# Patient Record
Sex: Female | Born: 1965 | Race: White | Hispanic: No | Smoking: Never smoker
Health system: Southern US, Community
[De-identification: ages and names within clinical notes are randomized; demographics above are authoritative.]

## PROBLEM LIST (undated history)

## (undated) DIAGNOSIS — F319 Bipolar disorder, unspecified: Secondary | ICD-10-CM

## (undated) DIAGNOSIS — K859 Acute pancreatitis without necrosis or infection, unspecified: Secondary | ICD-10-CM

## (undated) DIAGNOSIS — I10 Essential (primary) hypertension: Secondary | ICD-10-CM

## (undated) DIAGNOSIS — E079 Disorder of thyroid, unspecified: Secondary | ICD-10-CM

## (undated) DIAGNOSIS — E876 Hypokalemia: Secondary | ICD-10-CM

## (undated) DIAGNOSIS — E119 Type 2 diabetes mellitus without complications: Secondary | ICD-10-CM

## (undated) HISTORY — PX: ABDOMINAL HYSTERECTOMY: SHX81

## (undated) HISTORY — PX: CHOLECYSTECTOMY: SHX55

---

## 2014-04-06 ENCOUNTER — Emergency Department (HOSPITAL_BASED_OUTPATIENT_CLINIC_OR_DEPARTMENT_OTHER)
Admission: EM | Admit: 2014-04-06 | Discharge: 2014-04-06 | Disposition: A | Discharge status: Home / Self Care | Payer: Medicare HMO | Attending: Emergency Medicine | Admitting: Emergency Medicine

## 2014-04-06 ENCOUNTER — Emergency Department (HOSPITAL_BASED_OUTPATIENT_CLINIC_OR_DEPARTMENT_OTHER): Payer: Medicare HMO

## 2014-04-06 ENCOUNTER — Encounter (HOSPITAL_BASED_OUTPATIENT_CLINIC_OR_DEPARTMENT_OTHER): Payer: Self-pay | Admitting: Emergency Medicine

## 2014-04-06 DIAGNOSIS — K7689 Other specified diseases of liver: Secondary | ICD-10-CM | POA: Insufficient documentation

## 2014-04-06 DIAGNOSIS — E079 Disorder of thyroid, unspecified: Secondary | ICD-10-CM | POA: Insufficient documentation

## 2014-04-06 DIAGNOSIS — K760 Fatty (change of) liver, not elsewhere classified: Secondary | ICD-10-CM

## 2014-04-06 DIAGNOSIS — I10 Essential (primary) hypertension: Secondary | ICD-10-CM | POA: Insufficient documentation

## 2014-04-06 DIAGNOSIS — Z794 Long term (current) use of insulin: Secondary | ICD-10-CM | POA: Insufficient documentation

## 2014-04-06 DIAGNOSIS — Z79899 Other long term (current) drug therapy: Secondary | ICD-10-CM | POA: Insufficient documentation

## 2014-04-06 DIAGNOSIS — R109 Unspecified abdominal pain: Secondary | ICD-10-CM | POA: Insufficient documentation

## 2014-04-06 DIAGNOSIS — E119 Type 2 diabetes mellitus without complications: Secondary | ICD-10-CM | POA: Insufficient documentation

## 2014-04-06 DIAGNOSIS — Z8719 Personal history of other diseases of the digestive system: Secondary | ICD-10-CM | POA: Insufficient documentation

## 2014-04-06 DIAGNOSIS — F319 Bipolar disorder, unspecified: Secondary | ICD-10-CM | POA: Insufficient documentation

## 2014-04-06 HISTORY — DX: Disorder of thyroid, unspecified: E07.9

## 2014-04-06 HISTORY — DX: Hypokalemia: E87.6

## 2014-04-06 HISTORY — DX: Type 2 diabetes mellitus without complications: E11.9

## 2014-04-06 HISTORY — DX: Acute pancreatitis without necrosis or infection, unspecified: K85.90

## 2014-04-06 HISTORY — DX: Bipolar disorder, unspecified: F31.9

## 2014-04-06 HISTORY — DX: Essential (primary) hypertension: I10

## 2014-04-06 LAB — COMPREHENSIVE METABOLIC PANEL
ALT: 82 U/L — ABNORMAL HIGH (ref 0–35)
ANION GAP: 16 — AB (ref 5–15)
AST: 70 U/L — ABNORMAL HIGH (ref 0–37)
Albumin: 4.2 g/dL (ref 3.5–5.2)
Alkaline Phosphatase: 111 U/L (ref 39–117)
BILIRUBIN TOTAL: 0.8 mg/dL (ref 0.3–1.2)
BUN: 11 mg/dL (ref 6–23)
CHLORIDE: 98 meq/L (ref 96–112)
CO2: 23 meq/L (ref 19–32)
CREATININE: 0.7 mg/dL (ref 0.50–1.10)
Calcium: 9.8 mg/dL (ref 8.4–10.5)
GFR calc Af Amer: >90 mL/min (ref >90)
Glucose, Bld: 254 mg/dL — ABNORMAL HIGH (ref 70–99)
Potassium: 3.3 mEq/L — ABNORMAL LOW (ref 3.7–5.3)
Sodium: 137 mEq/L (ref 137–147)
Total Protein: 8 g/dL (ref 6.0–8.3)

## 2014-04-06 LAB — CBC WITH DIFFERENTIAL/PLATELET
BASOS ABS: 0.1 10*3/uL (ref 0.0–0.1)
BASOS PCT: 1 % (ref 0–1)
Eosinophils Absolute: 0.4 10*3/uL (ref 0.0–0.7)
Eosinophils Relative: 4 % (ref 0–5)
HCT: 43.8 % (ref 36.0–46.0)
Hemoglobin: 15.2 g/dL — ABNORMAL HIGH (ref 12.0–15.0)
Lymphocytes Relative: 30 % (ref 12–46)
Lymphs Abs: 2.5 10*3/uL (ref 0.7–4.0)
MCH: 30.9 pg (ref 26.0–34.0)
MCHC: 34.7 g/dL (ref 30.0–36.0)
MCV: 89 fL (ref 78.0–100.0)
MONO ABS: 0.8 10*3/uL (ref 0.1–1.0)
Monocytes Relative: 10 % (ref 3–12)
NEUTROS ABS: 4.6 10*3/uL (ref 1.7–7.7)
NEUTROS PCT: 55 % (ref 43–77)
Platelets: 165 10*3/uL (ref 150–400)
RBC: 4.92 MIL/uL (ref 3.87–5.11)
RDW: 13.1 % (ref 11.5–15.5)
WBC: 8.3 10*3/uL (ref 4.0–10.5)

## 2014-04-06 LAB — URINE MICROSCOPIC-ADD ON

## 2014-04-06 LAB — URINALYSIS, ROUTINE W REFLEX MICROSCOPIC
Glucose, UA: 500 mg/dL — AB
Hgb urine dipstick: NEGATIVE
KETONES UR: NEGATIVE mg/dL
Nitrite: NEGATIVE
PROTEIN: 100 mg/dL — AB
Specific Gravity, Urine: 1.031 — ABNORMAL HIGH (ref 1.005–1.030)
Urobilinogen, UA: 0.2 mg/dL (ref 0.0–1.0)
pH: 6 (ref 5.0–8.0)

## 2014-04-06 LAB — LIPASE, BLOOD: Lipase: 33 U/L (ref 11–59)

## 2014-04-06 MED ORDER — PANTOPRAZOLE SODIUM 40 MG IV SOLR
40.0000 mg | Freq: Once | INTRAVENOUS | Status: AC
  Administered 2014-04-06: 40 mg via INTRAVENOUS
  Filled 2014-04-06: qty 40

## 2014-04-06 MED ORDER — ONDANSETRON HCL 4 MG/2ML IJ SOLN
4.0000 mg | Freq: Once | INTRAMUSCULAR | Status: AC
  Administered 2014-04-06: 4 mg via INTRAVENOUS
  Filled 2014-04-06: qty 2

## 2014-04-06 MED ORDER — SODIUM CHLORIDE 0.9 % IV SOLN
INTRAVENOUS | Status: DC
  Administered 2014-04-06: 07:00:00 via INTRAVENOUS

## 2014-04-06 MED ORDER — IOHEXOL 300 MG/ML  SOLN
50.0000 mL | Freq: Once | INTRAMUSCULAR | Status: AC | PRN
  Administered 2014-04-06: 50 mL via ORAL

## 2014-04-06 MED ORDER — ONDANSETRON HCL 4 MG/2ML IJ SOLN
4.0000 mg | Freq: Once | INTRAMUSCULAR | Status: AC
  Administered 2014-04-06: 4 mg via INTRAVENOUS

## 2014-04-06 MED ORDER — IOHEXOL 300 MG/ML  SOLN
100.0000 mL | Freq: Once | INTRAMUSCULAR | Status: AC | PRN
  Administered 2014-04-06: 100 mL via INTRAVENOUS

## 2014-04-06 MED ORDER — ONDANSETRON HCL 4 MG/2ML IJ SOLN
4.0000 mg | Freq: Once | INTRAMUSCULAR | Status: DC
  Filled 2014-04-06: qty 2

## 2014-04-06 MED ORDER — HYDROMORPHONE HCL PF 1 MG/ML IJ SOLN
1.0000 mg | Freq: Once | INTRAMUSCULAR | Status: AC
  Administered 2014-04-06: 1 mg via INTRAVENOUS
  Filled 2014-04-06: qty 1

## 2014-04-06 MED ORDER — SODIUM CHLORIDE 0.9 % IV BOLUS (SEPSIS): 1000.0000 mL | Freq: Once | INTRAVENOUS | Status: DC

## 2014-04-06 NOTE — ED Provider Notes (Signed)
CSN: [id number]     Arrival date & time 04/06/14  [id number] History   First MD Initiated Contact with Patient 04/06/14 [phone]     Chief Complaint  Patient presents with  . Abdominal Pain     (Consider location/radiation/quality/duration/timing/severity/associated sxs/prior Treatment) HPI This is a 48 year old female with a history of statin-induced pancreatitis. She was recently treated for a urinary tract infection and is on nitrofurantoin and phenazopyridine. She is here with a two-day history of epigastric right upper quadrant pain that acutely worsened this morning. Her pain is severe. It is sharp and stabbing. It is like previous pancreatitis pain. It has been associated with nausea, vomiting and diarrhea. It is worse with movement or palpation. She is status post cholecystectomy.  Past Medical History  Diagnosis Date  . Pancreatitis   . Thyroid disease   . Hypertension   . Diabetes mellitus without complication   . Hypokalemia   . Bipolar 1 disorder    Past Surgical History  Procedure Laterality Date  . Abdominal hysterectomy    . Cholecystectomy     No family history on file. History  Substance Use Topics  . Smoking status: Never Smoker   . Smokeless tobacco: Not on file  . Alcohol Use: No   OB History   Grav Para Term Preterm Abortions TAB SAB Ect Mult Living                 Review of Systems  All other systems reviewed and are negative.   Allergies  Crestor and Metformin and related  Home Medications   Prior to Admission medications   Medication Sig Start Date End Date Taking? Authorizing Provider  ARIPiprazole (ABILIFY) 10 MG tablet Take 10 mg by mouth daily.   Yes Historical Provider, MD  chlorthalidone (HYGROTON) 25 MG tablet Take 25 mg by mouth daily.   Yes Historical Provider, MD  citalopram (CELEXA) 20 MG tablet Take 20 mg by mouth daily.   Yes Historical Provider, MD  ergocalciferol (DRISDOL) 8000 UNIT/ML drops Take by mouth daily.   Yes Historical  Provider, MD  esomeprazole (NEXIUM) 40 MG capsule Take 40 mg by mouth daily at 12 noon.   Yes Historical Provider, MD  glimepiride (AMARYL) 2 MG tablet Take 2 mg by mouth daily with breakfast.   Yes Historical Provider, MD  hydrOXYzine (ATARAX/VISTARIL) 50 MG tablet Take 50 mg by mouth 3 (three) times daily as needed.   Yes Historical Provider, MD  insulin glargine (LANTUS) 100 UNIT/ML injection Inject into the skin at bedtime.   Yes Historical Provider, MD  lamoTRIgine (LAMICTAL) 100 MG tablet Take 100 mg by mouth daily.   Yes Historical Provider, MD  levothyroxine (SYNTHROID, LEVOTHROID) 100 MCG tablet Take 100 mcg by mouth daily before breakfast.   Yes Historical Provider, MD  meloxicam (MOBIC) 15 MG tablet Take 15 mg by mouth daily.   Yes Historical Provider, MD  nitrofurantoin (MACRODANTIN) 50 MG capsule Take 50 mg by mouth 4 (four) times daily.   Yes Historical Provider, MD  phenazopyridine (PYRIDIUM) 200 MG tablet Take 200 mg by mouth 3 (three) times daily as needed for pain.   Yes Historical Provider, MD  potassium chloride (K-DUR) 10 MEQ tablet Take 20 mEq by mouth 3 (three) times daily.   Yes Historical Provider, MD   BP 126/96  Pulse 110  Temp(Src) 98.2 F (36.8 C) (Oral)  Resp 20  Ht 5\' 7"  (1.702 m)  Wt 260 lb (117.935 kg)  BMI 40.71 kg/m2  SpO2 97%  Physical Exam General: Well-developed, obese female in no acute distress; appearance consistent with age of record HENT: normocephalic; atraumatic Eyes: pupils equal, round and reactive to light; extraocular muscles intact; xanthomas Neck: supple Heart: regular rate and rhythm Lungs: clear to auscultation bilaterally Abdomen: soft; nondistended; epigastric and right upper quadrant tenderness; no masses or hepatosplenomegaly; bowel sounds present Extremities: No deformity; full range of motion; pulses normal Neurologic: Awake, alert and oriented; motor function intact in all extremities and symmetric; no facial droop Skin: Warm  and dry Psychiatric: Normal mood and affect    ED Course  Procedures (including critical care time)  MDM   Nursing notes and vitals signs, including pulse oximetry, reviewed.  Summary of this visit's results, reviewed by myself:  Labs:  Results for orders placed during the hospital encounter of 04/06/14 (from the past 24 hour(s))  LIPASE, BLOOD     Status: None   Collection Time    04/06/14  6:30 AM      Result Value Ref Range   Lipase 33  11 - 59 U/L  COMPREHENSIVE METABOLIC PANEL     Status: Abnormal   Collection Time    04/06/14  6:30 AM      Result Value Ref Range   Sodium 137  137 - 147 mEq/L   Potassium 3.3 (*) 3.7 - 5.3 mEq/L   Chloride 98  96 - 112 mEq/L   CO2 23  19 - 32 mEq/L   Glucose, Bld 254 (*) 70 - 99 mg/dL   BUN 11  6 - 23 mg/dL   Creatinine, Ser 1.61  0.50 - 1.10 mg/dL   Calcium 9.8  8.4 - 09.6 mg/dL   Total Protein 8.0  6.0 - 8.3 g/dL   Albumin 4.2  3.5 - 5.2 g/dL   AST 70 (*) 0 - 37 U/L   ALT 82 (*) 0 - 35 U/L   Alkaline Phosphatase 111  39 - 117 U/L   Total Bilirubin 0.8  0.3 - 1.2 mg/dL   GFR calc non Af Amer >90  >90 mL/min   GFR calc Af Amer >90  >90 mL/min   Anion gap 16 (*) 5 - 15  CBC WITH DIFFERENTIAL     Status: Abnormal   Collection Time    04/06/14  6:30 AM      Result Value Ref Range   WBC 8.3  4.0 - 10.5 K/uL   RBC 4.92  3.87 - 5.11 MIL/uL   Hemoglobin 15.2 (*) 12.0 - 15.0 g/dL   HCT 04.5  40.9 - 81.1 %   MCV 89.0  78.0 - 100.0 fL   MCH 30.9  26.0 - 34.0 pg   MCHC 34.7  30.0 - 36.0 g/dL   RDW 91.4  78.2 - 95.6 %   Platelets 165  150 - 400 K/uL   Neutrophils Relative % 55  43 - 77 %   Neutro Abs 4.6  1.7 - 7.7 K/uL   Lymphocytes Relative 30  12 - 46 %   Lymphs Abs 2.5  0.7 - 4.0 K/uL   Monocytes Relative 10  3 - 12 %   Monocytes Absolute 0.8  0.1 - 1.0 K/uL   Eosinophils Relative 4  0 - 5 %   Eosinophils Absolute 0.4  0.0 - 0.7 K/uL   Basophils Relative 1  0 - 1 %   Basophils Absolute 0.1  0.0 - 0.1 K/uL  URINALYSIS,  ROUTINE W REFLEX MICROSCOPIC  Status: Abnormal   Collection Time    04/06/14  6:36 AM      Result Value Ref Range   Color, Urine AMBER (*) YELLOW   APPearance CLOUDY (*) CLEAR   Specific Gravity, Urine 1.031 (*) 1.005 - 1.030   pH 6.0  5.0 - 8.0   Glucose, UA 500 (*) NEGATIVE mg/dL   Hgb urine dipstick NEGATIVE  NEGATIVE   Bilirubin Urine SMALL (*) NEGATIVE   Ketones, ur NEGATIVE  NEGATIVE mg/dL   Protein, ur 284 (*) NEGATIVE mg/dL   Urobilinogen, UA 0.2  0.0 - 1.0 mg/dL   Nitrite NEGATIVE  NEGATIVE   Leukocytes, UA SMALL (*) NEGATIVE  URINE MICROSCOPIC-ADD ON     Status: Abnormal   Collection Time    04/06/14  6:36 AM      Result Value Ref Range   Squamous Epithelial / LPF MANY (*) RARE   WBC, UA 7-10  <3 WBC/hpf   RBC / HPF 0-2  <3 RBC/hpf   Bacteria, UA MANY (*) RARE   Casts HYALINE CASTS (*) NEGATIVE   Urine-Other MUCOUS PRESENT      Imaging Studies: No results found.  7:36 AM Dr. Jodi Mourning to follow up on CT scan.      Hanley Seamen, MD 04/06/14 [phone]

## 2014-04-06 NOTE — Discharge Instructions (Signed)
If you were given medicines take as directed.  If you are on coumadin or contraceptives realize their levels and effectiveness is altered by many different medicines.  If you have any reaction (rash, tongues swelling, other) to the medicines stop taking and see a physician.   Please follow up as directed and return to the ER or see a physician for new or worsening symptoms.  Thank you. Filed Vitals:   04/06/14 0604 04/06/14 0747  BP: 126/96 113/40  Pulse: 110 94  Temp: 98.2 F (36.8 C)   TempSrc: Oral   Resp: 20 20  Height: 5\' 7"  (1.702 m)   Weight: 260 lb (117.935 kg)   SpO2: 97% 96%    Abdominal Pain, Women Abdominal (stomach, pelvic, or belly) pain can be caused by many things. It is important to tell your doctor:  The location of the pain.  Does it come and go or is it present all the time?  Are there things that start the pain (eating certain foods, exercise)?  Are there other symptoms associated with the pain (fever, nausea, vomiting, diarrhea)? All of this is helpful to know when trying to find the cause of the pain. CAUSES   Stomach: virus or bacteria infection, or ulcer.  Intestine: appendicitis (inflamed appendix), regional ileitis (Crohn's disease), ulcerative colitis (inflamed colon), irritable bowel syndrome, diverticulitis (inflamed diverticulum of the colon), or cancer of the stomach or intestine.  Gallbladder disease or stones in the gallbladder.  Kidney disease, kidney stones, or infection.  Pancreas infection or cancer.  Fibromyalgia (pain disorder).  Diseases of the female organs:  Uterus: fibroid (non-cancerous) tumors or infection.  Fallopian tubes: infection or tubal pregnancy.  Ovary: cysts or tumors.  Pelvic adhesions (scar tissue).  Endometriosis (uterus lining tissue growing in the pelvis and on the pelvic organs).  Pelvic congestion syndrome (female organs filling up with blood just before the menstrual period).  Pain with the menstrual  period.  Pain with ovulation (producing an egg).  Pain with an IUD (intrauterine device, birth control) in the uterus.  Cancer of the female organs.  Functional pain (pain not caused by a disease, may improve without treatment).  Psychological pain.  Depression. DIAGNOSIS  Your doctor will decide the seriousness of your pain by doing an examination.  Blood tests.  X-rays.  Ultrasound.  CT scan (computed tomography, special type of X-ray).  MRI (magnetic resonance imaging).  Cultures, for infection.  Barium enema (dye inserted in the large intestine, to better view it with X-rays).  Colonoscopy (looking in intestine with a lighted tube).  Laparoscopy (minor surgery, looking in abdomen with a lighted tube).  Major abdominal exploratory surgery (looking in abdomen with a large incision). TREATMENT  The treatment will depend on the cause of the pain.   Many cases can be observed and treated at home.  Over-the-counter medicines recommended by your caregiver.  Prescription medicine.  Antibiotics, for infection.  Birth control pills, for painful periods or for ovulation pain.  Hormone treatment, for endometriosis.  Nerve blocking injections.  Physical therapy.  Antidepressants.  Counseling with a psychologist or psychiatrist.  Minor or major surgery. HOME CARE INSTRUCTIONS   Do not take laxatives, unless directed by your caregiver.  Take over-the-counter pain medicine only if ordered by your caregiver. Do not take aspirin because it can cause an upset stomach or bleeding.  Try a clear liquid diet (broth or water) as ordered by your caregiver. Slowly move to a bland diet, as tolerated, if the pain  is related to the stomach or intestine.  Have a thermometer and take your temperature several times a day, and record it.  Bed rest and sleep, if it helps the pain.  Avoid sexual intercourse, if it causes pain.  Avoid stressful situations.  Keep your  follow-up appointments and tests, as your caregiver orders.  If the pain does not go away with medicine or surgery, you may try:  Acupuncture.  Relaxation exercises (yoga, meditation).  Group therapy.  Counseling. SEEK MEDICAL CARE IF:   You notice certain foods cause stomach pain.  Your home care treatment is not helping your pain.  You need stronger pain medicine.  You want your IUD removed.  You feel faint or lightheaded.  You develop nausea and vomiting.  You develop a rash.  You are having side effects or an allergy to your medicine. SEEK IMMEDIATE MEDICAL CARE IF:   Your pain does not go away or gets worse.  You have a fever.  Your pain is felt only in portions of the abdomen. The right side could possibly be appendicitis. The left lower portion of the abdomen could be colitis or diverticulitis.  You are passing blood in your stools (bright red or black tarry stools, with or without vomiting).  You have blood in your urine.  You develop chills, with or without a fever.  You pass out. MAKE SURE YOU:   Understand these instructions.  Will watch your condition.  Will get help right away if you are not doing well or get worse. Document Released: 07/06/2007 Document Revised: 12/01/2011 Document Reviewed: 07/26/2009 [facility] Patient Information 2015 [place], [place]. This information is not intended to replace advice given to you by your health care provider. Make sure you discuss any questions you have with your health care provider.

## 2014-04-06 NOTE — ED Notes (Signed)
Pt c/o upper abdominal pain with n/v/d x2days, states is currently being tx'd for a UTI

## 2014-04-06 NOTE — ED Provider Notes (Signed)
Patient signed out to followup CT scan and disposition. CT scan results reviewed with mild fatty liver, reassess patient feels much improved and no acute/emergent findings on the CT. Patient followup with primary Dr. Franchot Heidelbergt Abdomen Pelvis W Contrast  04/06/2014   CLINICAL DATA:  Upper abdominal pain and nausea.  EXAM: CT ABDOMEN AND PELVIS WITH CONTRAST  TECHNIQUE: Multidetector CT imaging of the abdomen and pelvis was performed using the standard protocol following bolus administration of intravenous contrast.  CONTRAST:  50mL OMNIPAQUE IOHEXOL 300 MG/ML SOLN, 100mL OMNIPAQUE IOHEXOL 300 MG/ML SOLN  COMPARISON:  01/13/2014.  05/21/2013.  07/16/2011.  05/10/2006.  FINDINGS: Lung bases are clear. No pleural or pericardial fluid. The liver is enlarged and shows fatty change. This could be painful. Relatively lower attenuation in the anterior aspect of the liver has been seen previously and is consistent with more advanced fatty infiltration. The spleen is chronically enlarged. Benign-appearing cyst in the dorsal aspect of the spleen. This measures 2 point cm transverse today as opposed to 2.1 cm on the previous exam. The pancreas is normal. The adrenal glands are normal. The kidneys are normal. The aorta shows mild atherosclerosis but no aneurysm. The IVC is normal. No free intraperitoneal fluid or air. There is diverticulosis of the colon without evidence of diverticulitis. The bladder is normal. There is been previous hysterectomy. No pelvic mass. 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 The appendix is normal.  IMPRESSION: Enlarged fatty liver. This is a chronic finding, but with slightly more liver enlargement over time. This could be a cause of pain. There is splenomegaly as well, increasing over time, with a benign cyst within the posterior spleen, slowly enlarged over time but not worrisome.  Diverticulosis of the colon without evidence of diverticulitis.   Electronically Signed   By:  Paulina FusiMark  Shogry M.D.   On: 04/06/2014 08:51   Abdominal pain, vomiting, Diverticulosis, Fatty liver  Maureena Dabbs M   Enid SkeensJoshua M Alyxis Grippi, MD 04/06/14 [phone]

## 2015-06-18 IMAGING — CT CT ABD-PELV W/ CM
2 of 5 series · 17 of 46 positions shown, 19 images · IV contrast (APPLIED)
Comparison: 01/13/2014.  05/21/2013.  07/16/2011.  05/10/2006.

CLINICAL DATA: Upper abdominal pain and nausea.

EXAM:
CT ABDOMEN AND PELVIS WITH CONTRAST
TECHNIQUE: Multidetector CT imaging of the abdomen and pelvis was performed
using the standard protocol following bolus administration of
intravenous contrast.
CONTRAST:  50mL OMNIPAQUE IOHEXOL 300 MG/ML SOLN, 100mL OMNIPAQUE
IOHEXOL 300 MG/ML SOLN

[Series 2: abd/pelvis 5.0 b31f · axial · 0.84mm/px · z∈[-492,-68]mm · 14 of 95 slices shown, 16 images]
[im 5/95  soft-tissue]
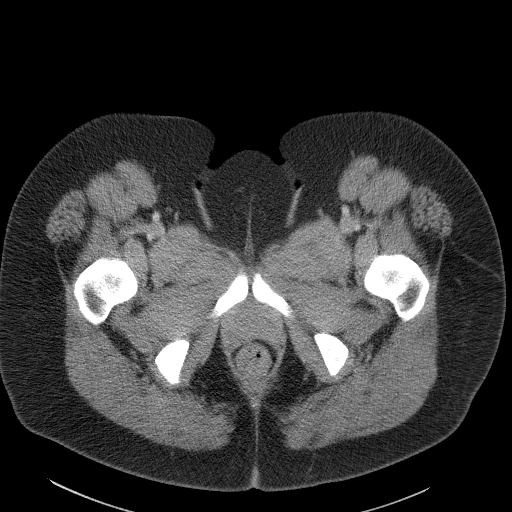
[im 5/95  bone]
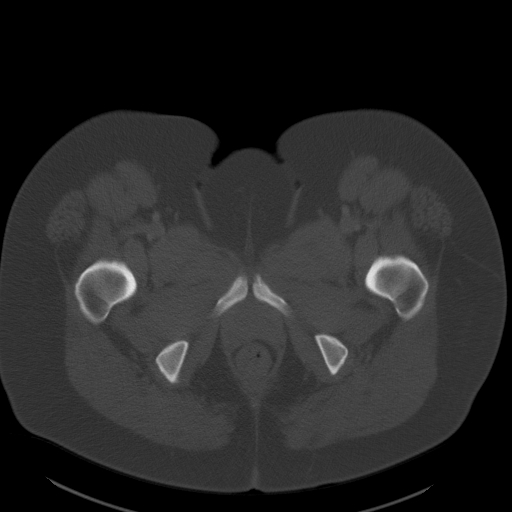
[im 10/95  soft-tissue]
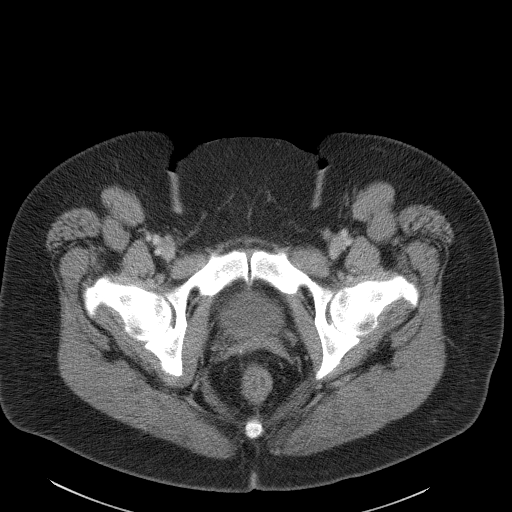
[im 20/95  soft-tissue]
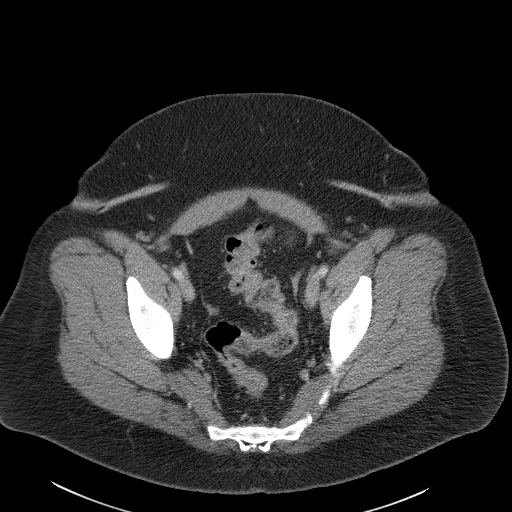
[im 25/95  soft-tissue]
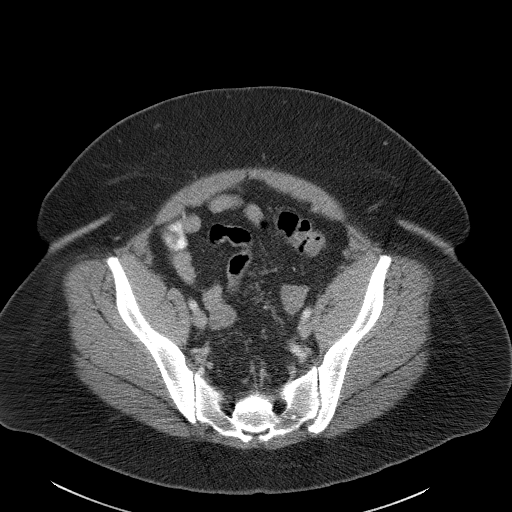
[im 30/95  soft-tissue]
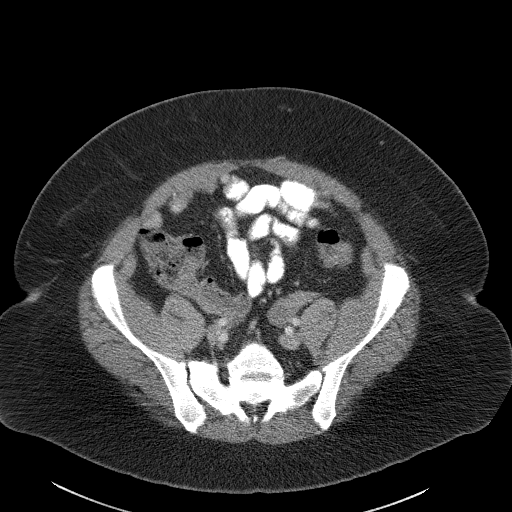
[im 40/95  soft-tissue]
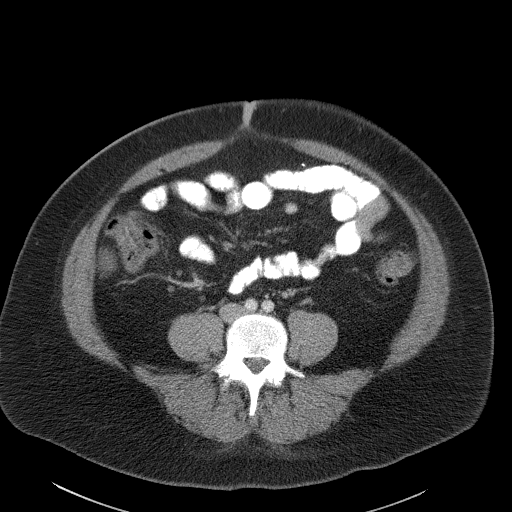
[im 45/95  soft-tissue]
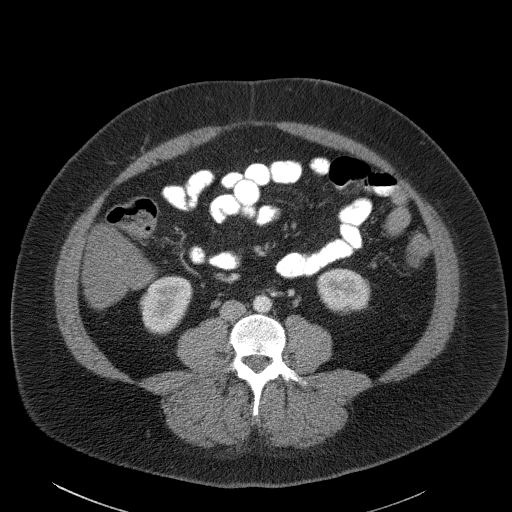
[im 50/95  soft-tissue]
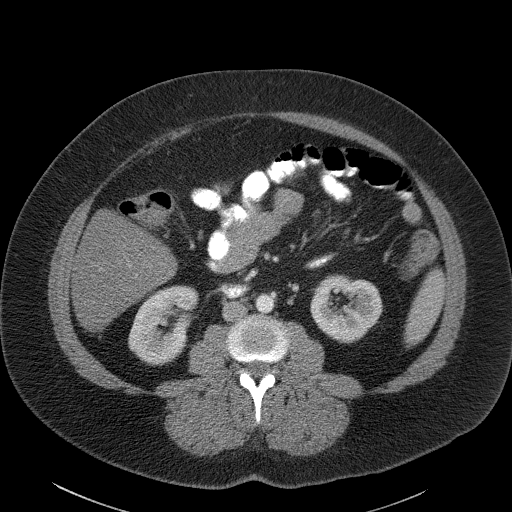
[im 55/95  soft-tissue]
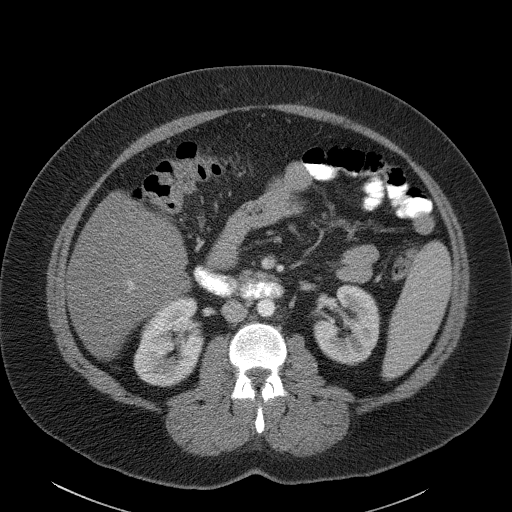
[im 55/95  bone]
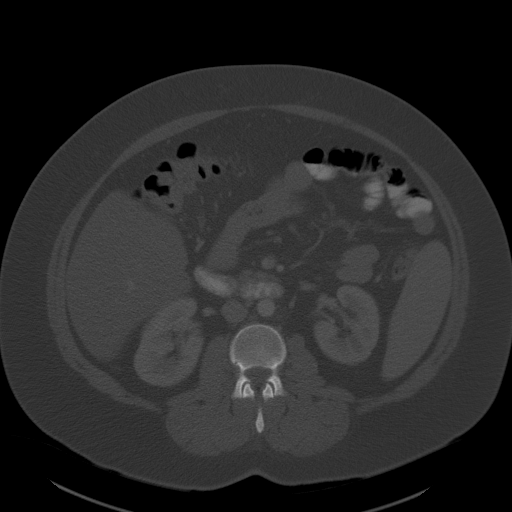
[im 65/95  soft-tissue]
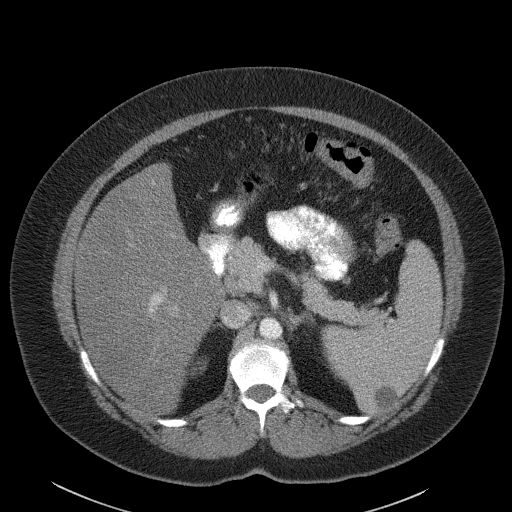
[im 70/95  soft-tissue]
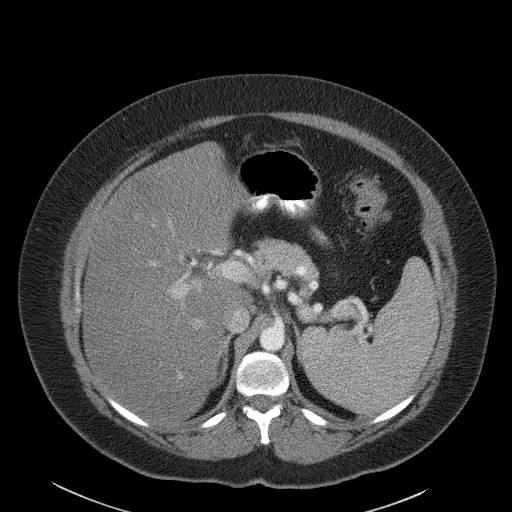
[im 75/95  soft-tissue]
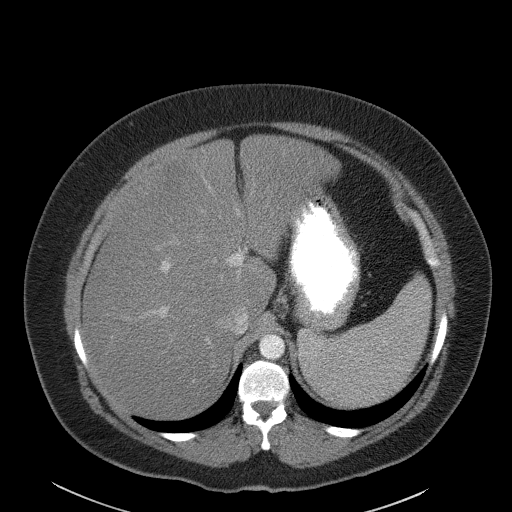
[im 85/95  soft-tissue]
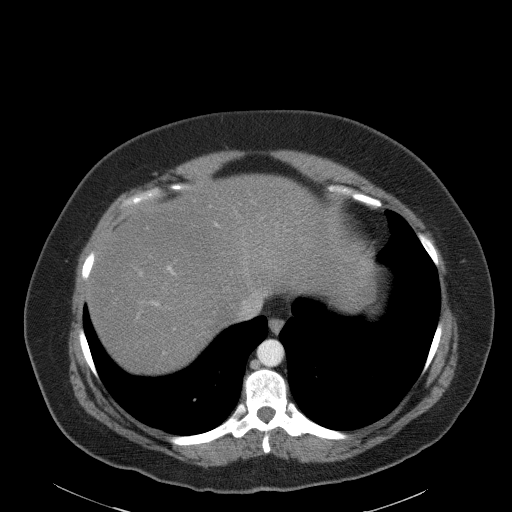
[im 90/95  soft-tissue]
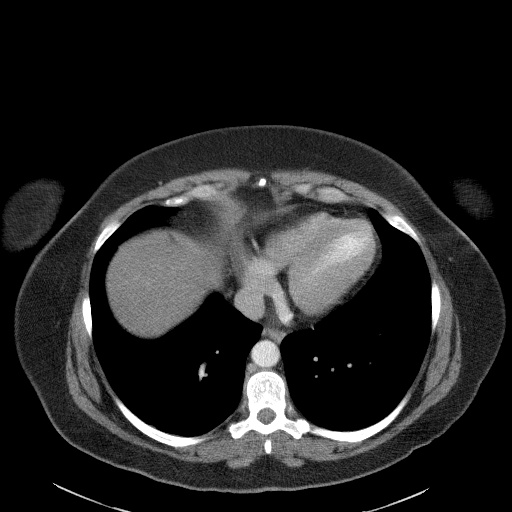

[Series 5: abd/pelvis 3.0 coronal · coronal · 0.89mm/px · 3 of 121 slices shown]
[im 41/121  soft-tissue]
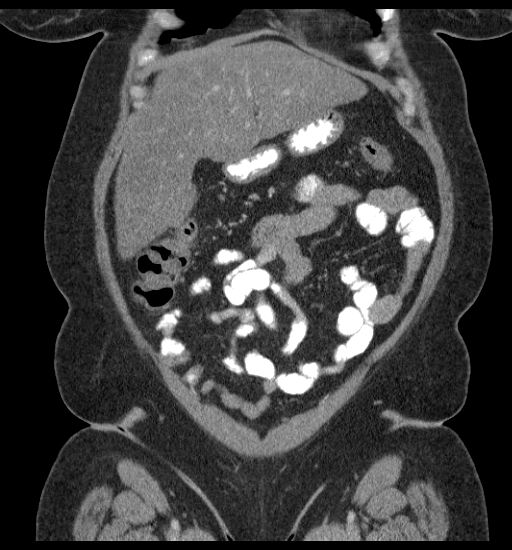
[im 54/121  soft-tissue]
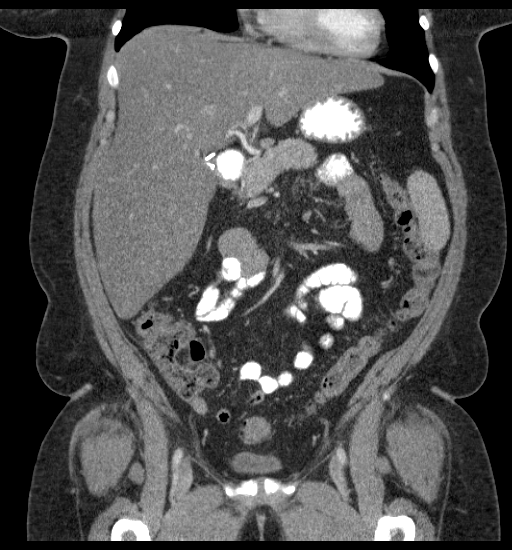
[im 67/121  soft-tissue]
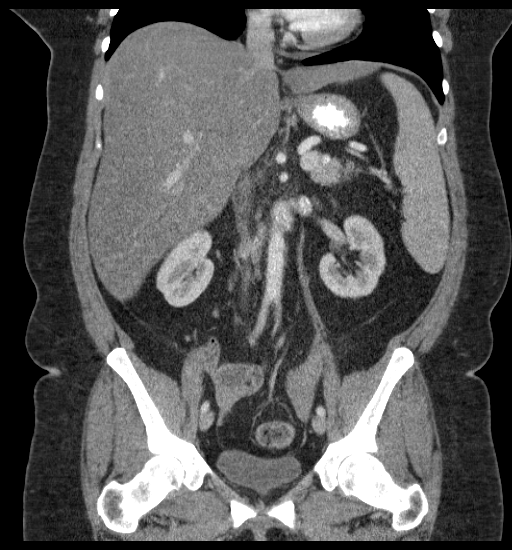

[17 of 46 positions shown; findings below may reference images not displayed]

FINDINGS: Lung bases are clear. No pleural or pericardial fluid. The liver is
enlarged and shows fatty change. This could be painful. Relatively
lower attenuation in the anterior aspect of the liver has been seen
previously and is consistent with more advanced fatty infiltration.
The spleen is chronically enlarged. Benign-appearing cyst in the
dorsal aspect of the spleen. This measures 2 point cm transverse
today as opposed to 2.1 cm on the previous exam. The pancreas is
normal. The adrenal glands are normal. The kidneys are normal. The
aorta shows mild atherosclerosis but no aneurysm. The IVC is normal.
No free intraperitoneal fluid or air. There is diverticulosis of the
colon without evidence of diverticulitis. The bladder is normal.
There is been previous hysterectomy. No pelvic mass. There is
degenerative facet arthritis in the lower lumbar spine. The appendix
is normal.
IMPRESSION: Enlarged fatty liver. This is a chronic finding, but with slightly
more liver enlargement over time. This could be a cause of pain.
There is splenomegaly as well, increasing over time, with a benign
cyst within the posterior spleen, slowly enlarged over time but not
worrisome.

Diverticulosis of the colon without evidence of diverticulitis.

## 2016-06-13 ENCOUNTER — Ambulatory Visit (INDEPENDENT_AMBULATORY_CARE_PROVIDER_SITE_OTHER): Payer: Medicare HMO | Admitting: Psychology

## 2016-06-13 DIAGNOSIS — F3132 Bipolar disorder, current episode depressed, moderate: Secondary | ICD-10-CM | POA: Diagnosis not present

## 2016-07-11 ENCOUNTER — Ambulatory Visit (INDEPENDENT_AMBULATORY_CARE_PROVIDER_SITE_OTHER): Payer: Medicare HMO | Admitting: Psychology

## 2016-07-11 DIAGNOSIS — F3132 Bipolar disorder, current episode depressed, moderate: Secondary | ICD-10-CM

## 2016-08-15 ENCOUNTER — Ambulatory Visit: Payer: Medicare HMO | Admitting: Psychology

## 2016-09-05 ENCOUNTER — Ambulatory Visit: Payer: Medicare HMO | Admitting: Psychology

## 2016-10-03 ENCOUNTER — Ambulatory Visit (INDEPENDENT_AMBULATORY_CARE_PROVIDER_SITE_OTHER): Payer: Medicare HMO | Admitting: Psychology

## 2016-10-03 DIAGNOSIS — F312 Bipolar disorder, current episode manic severe with psychotic features: Secondary | ICD-10-CM | POA: Diagnosis not present

## 2016-11-07 ENCOUNTER — Ambulatory Visit: Payer: Self-pay | Admitting: Psychology

## 2016-12-09 ENCOUNTER — Ambulatory Visit (INDEPENDENT_AMBULATORY_CARE_PROVIDER_SITE_OTHER): Payer: Medicare HMO | Admitting: Psychology

## 2016-12-09 DIAGNOSIS — F312 Bipolar disorder, current episode manic severe with psychotic features: Secondary | ICD-10-CM | POA: Diagnosis not present

## 2017-01-12 ENCOUNTER — Ambulatory Visit: Payer: Medicare HMO | Admitting: Psychology

## 2017-02-05 ENCOUNTER — Ambulatory Visit (INDEPENDENT_AMBULATORY_CARE_PROVIDER_SITE_OTHER): Payer: Medicare HMO | Admitting: Psychology

## 2017-02-05 DIAGNOSIS — F3132 Bipolar disorder, current episode depressed, moderate: Secondary | ICD-10-CM | POA: Diagnosis not present

## 2017-02-25 ENCOUNTER — Ambulatory Visit: Payer: Medicare HMO | Admitting: Psychology

## 2017-03-03 ENCOUNTER — Ambulatory Visit: Payer: Medicare HMO | Admitting: Psychology

## 2017-03-04 ENCOUNTER — Ambulatory Visit (INDEPENDENT_AMBULATORY_CARE_PROVIDER_SITE_OTHER): Payer: Medicare HMO | Admitting: Psychology

## 2017-03-04 DIAGNOSIS — F312 Bipolar disorder, current episode manic severe with psychotic features: Secondary | ICD-10-CM | POA: Diagnosis not present

## 2017-04-28 ENCOUNTER — Ambulatory Visit: Payer: Medicare HMO | Admitting: Psychology

## 2017-06-03 ENCOUNTER — Ambulatory Visit: Payer: Medicare HMO | Admitting: Psychology

## 2017-06-17 ENCOUNTER — Ambulatory Visit (INDEPENDENT_AMBULATORY_CARE_PROVIDER_SITE_OTHER): Payer: Medicare HMO | Admitting: Psychology

## 2017-06-17 DIAGNOSIS — F3132 Bipolar disorder, current episode depressed, moderate: Secondary | ICD-10-CM | POA: Diagnosis not present

## 2017-08-07 ENCOUNTER — Ambulatory Visit: Payer: Medicare HMO | Admitting: Psychology

## 2017-09-18 ENCOUNTER — Ambulatory Visit: Payer: Medicare HMO | Admitting: Psychology

## 2017-09-18 DIAGNOSIS — F3132 Bipolar disorder, current episode depressed, moderate: Secondary | ICD-10-CM | POA: Diagnosis not present

## 2017-11-27 ENCOUNTER — Ambulatory Visit: Payer: Medicare HMO | Admitting: Psychology

## 2018-03-26 ENCOUNTER — Ambulatory Visit (INDEPENDENT_AMBULATORY_CARE_PROVIDER_SITE_OTHER): Payer: Medicare HMO | Admitting: Psychology

## 2018-03-26 DIAGNOSIS — F3132 Bipolar disorder, current episode depressed, moderate: Secondary | ICD-10-CM

## 2018-05-27 ENCOUNTER — Ambulatory Visit: Payer: Medicare HMO | Admitting: Psychology

## 2018-06-08 ENCOUNTER — Ambulatory Visit (INDEPENDENT_AMBULATORY_CARE_PROVIDER_SITE_OTHER): Payer: Medicare HMO | Admitting: Psychology

## 2018-06-08 DIAGNOSIS — F3132 Bipolar disorder, current episode depressed, moderate: Secondary | ICD-10-CM

## 2018-08-02 ENCOUNTER — Ambulatory Visit: Payer: Medicare HMO | Admitting: Psychology

## 2018-09-27 ENCOUNTER — Ambulatory Visit (INDEPENDENT_AMBULATORY_CARE_PROVIDER_SITE_OTHER): Payer: Medicare HMO | Admitting: Psychology

## 2018-09-27 DIAGNOSIS — F3132 Bipolar disorder, current episode depressed, moderate: Secondary | ICD-10-CM | POA: Diagnosis not present

## 2018-11-16 ENCOUNTER — Ambulatory Visit: Payer: Medicare HMO | Admitting: Psychology

## 2019-01-03 ENCOUNTER — Ambulatory Visit (INDEPENDENT_AMBULATORY_CARE_PROVIDER_SITE_OTHER): Payer: Medicare HMO | Admitting: Psychology

## 2019-01-03 DIAGNOSIS — F3132 Bipolar disorder, current episode depressed, moderate: Secondary | ICD-10-CM | POA: Diagnosis not present

## 2019-01-25 ENCOUNTER — Ambulatory Visit (INDEPENDENT_AMBULATORY_CARE_PROVIDER_SITE_OTHER): Payer: Medicare HMO | Admitting: Psychology

## 2019-01-25 DIAGNOSIS — F3132 Bipolar disorder, current episode depressed, moderate: Secondary | ICD-10-CM

## 2019-03-29 ENCOUNTER — Ambulatory Visit (INDEPENDENT_AMBULATORY_CARE_PROVIDER_SITE_OTHER): Payer: Medicare HMO | Admitting: Psychology

## 2019-03-29 DIAGNOSIS — F3132 Bipolar disorder, current episode depressed, moderate: Secondary | ICD-10-CM | POA: Diagnosis not present

## 2019-04-28 ENCOUNTER — Ambulatory Visit (INDEPENDENT_AMBULATORY_CARE_PROVIDER_SITE_OTHER): Payer: Medicare HMO | Admitting: Psychology

## 2019-04-28 DIAGNOSIS — F3132 Bipolar disorder, current episode depressed, moderate: Secondary | ICD-10-CM | POA: Diagnosis not present

## 2019-06-28 ENCOUNTER — Ambulatory Visit (INDEPENDENT_AMBULATORY_CARE_PROVIDER_SITE_OTHER): Payer: Medicare HMO | Admitting: Psychology

## 2019-06-28 DIAGNOSIS — F3132 Bipolar disorder, current episode depressed, moderate: Secondary | ICD-10-CM

## 2019-07-26 ENCOUNTER — Ambulatory Visit (INDEPENDENT_AMBULATORY_CARE_PROVIDER_SITE_OTHER): Payer: Medicare HMO | Admitting: Psychology

## 2019-07-26 DIAGNOSIS — F3132 Bipolar disorder, current episode depressed, moderate: Secondary | ICD-10-CM

## 2019-09-08 ENCOUNTER — Ambulatory Visit (INDEPENDENT_AMBULATORY_CARE_PROVIDER_SITE_OTHER): Payer: Medicare HMO | Admitting: Psychology

## 2019-09-08 DIAGNOSIS — F3132 Bipolar disorder, current episode depressed, moderate: Secondary | ICD-10-CM

## 2019-09-26 ENCOUNTER — Ambulatory Visit (INDEPENDENT_AMBULATORY_CARE_PROVIDER_SITE_OTHER): Payer: Medicare HMO | Admitting: Psychology

## 2019-09-26 DIAGNOSIS — F3132 Bipolar disorder, current episode depressed, moderate: Secondary | ICD-10-CM

## 2019-10-24 ENCOUNTER — Ambulatory Visit (INDEPENDENT_AMBULATORY_CARE_PROVIDER_SITE_OTHER): Payer: Medicare HMO | Admitting: Psychology

## 2019-10-24 DIAGNOSIS — F3132 Bipolar disorder, current episode depressed, moderate: Secondary | ICD-10-CM | POA: Diagnosis not present

## 2019-12-02 ENCOUNTER — Ambulatory Visit: Payer: Medicare HMO | Admitting: Psychology

## 2019-12-26 ENCOUNTER — Ambulatory Visit (INDEPENDENT_AMBULATORY_CARE_PROVIDER_SITE_OTHER): Payer: Medicare HMO | Admitting: Psychology

## 2019-12-26 DIAGNOSIS — F3132 Bipolar disorder, current episode depressed, moderate: Secondary | ICD-10-CM | POA: Diagnosis not present

## 2020-01-23 ENCOUNTER — Ambulatory Visit (INDEPENDENT_AMBULATORY_CARE_PROVIDER_SITE_OTHER): Payer: Medicare HMO | Admitting: Psychology

## 2020-01-23 ENCOUNTER — Telehealth: Payer: Medicare HMO | Admitting: Physician Assistant

## 2020-01-23 DIAGNOSIS — F3132 Bipolar disorder, current episode depressed, moderate: Secondary | ICD-10-CM

## 2020-01-23 DIAGNOSIS — M545 Low back pain, unspecified: Secondary | ICD-10-CM

## 2020-01-23 MED ORDER — CYCLOBENZAPRINE HCL 10 MG PO TABS: 10.0000 mg | ORAL_TABLET | Freq: Three times a day (TID) | ORAL | 0 refills | Status: AC | PRN

## 2020-01-23 NOTE — Progress Notes (Signed)
Hi Rebecca Floyd,  I am sorry you are not feeling well.  I see on your questionnaire you stated that you have a history of fever associated with this back pain.   I messaged to you ask as to whether you are experiencing fever with your current episode of back pain, but have not heard back.  If you are indeed experiencing a fever with this back pain, please go to an Urgent Care or Emergency Department.    If you are not having a fever, here is how we plan to help:  Based on what you have shared with me it looks like you mostly have acute back pain.   Please be sure to follow-up with your PCP regarding this problem  Acute back pain is defined as musculoskeletal pain that can resolve in 1-3 weeks with conservative treatment.  I have prescribed Flexeril 10 mg every eight hours as needed which is a muscle relaxer  Some patients experience stomach irritation or in increased heartburn with anti-inflammatory drugs.  Please keep in mind that muscle relaxer's can cause fatigue and should not be taken while at work or driving.  Back pain is very common.  The pain often gets better over time.  The cause of back pain is usually not dangerous.  Most people can learn to manage their back pain on their own.  Home Care  Stay active.  Start with short walks on flat ground if you can.  Try to walk farther each day.  Do not sit, drive or stand in one place for more than 30 minutes.  Do not stay in bed.  Do not avoid exercise or work.  Activity can help your back heal faster.  Be careful when you bend or lift an object.  Bend at your knees, keep the object close to you, and do not twist.  Sleep on a firm mattress.  Lie on your side, and bend your knees.  If you lie on your back, put a pillow under your knees.  Only take medicines as told by your doctor.  Put ice on the injured area.  Put ice in a plastic bag  Place a towel between your skin and the bag  Leave the ice on for 15-20 minutes, 3-4 times a day  for the first 2-3 days. 210 After that, you can switch between ice and heat packs.  Ask your doctor about back exercises or massage.  Avoid feeling anxious or stressed.  Find good ways to deal with stress, such as exercise.  Get Help Right Way If:  Your pain does not go away with rest or medicine.  Your pain does not go away in 1 week.  You have new problems.  You do not feel well.  The pain spreads into your legs.  You cannot control when you poop (bowel movement) or pee (urinate)  You feel sick to your stomach (nauseous) or throw up (vomit)  You have belly (abdominal) pain.  You feel like you may pass out (faint).  If you develop a fever.  Make Sure you:  Understand these instructions.  Will watch your condition  Will get help right away if you are not doing well or get worse.  Your e-visit answers were reviewed by a board certified advanced clinical practitioner to complete your personal care plan.  Depending on the condition, your plan could have included both over the counter or prescription medications.  If there is a problem please reply  once you have received  a response from your provider.  Your safety is important to [place].  If you have drug allergies check your prescription carefully.    You can use MyChart to ask questions about today's visit, request a non-urgent call back, or ask for a work or school excuse for 24 hours related to this e-Visit. If it has been greater than 24 hours you will need to follow up with your provider, or enter a new e-Visit to address those concerns.  You will get an e-mail in the next two days asking about your experience.  I hope that your e-visit has been valuable and will speed your recovery. Thank you for using e-visits.  Greater than 5 minutes, yet less than 10 minutes of time have been spent researching, coordinating and implementing care for this patient today.

## 2020-03-20 ENCOUNTER — Ambulatory Visit (INDEPENDENT_AMBULATORY_CARE_PROVIDER_SITE_OTHER): Payer: Medicare HMO | Admitting: Psychology

## 2020-03-20 DIAGNOSIS — F3132 Bipolar disorder, current episode depressed, moderate: Secondary | ICD-10-CM

## 2020-04-25 ENCOUNTER — Ambulatory Visit (INDEPENDENT_AMBULATORY_CARE_PROVIDER_SITE_OTHER): Payer: Medicare HMO | Admitting: Psychology

## 2020-04-25 DIAGNOSIS — F3132 Bipolar disorder, current episode depressed, moderate: Secondary | ICD-10-CM | POA: Diagnosis not present

## 2020-06-20 ENCOUNTER — Ambulatory Visit (INDEPENDENT_AMBULATORY_CARE_PROVIDER_SITE_OTHER): Payer: Medicare HMO | Admitting: Psychology

## 2020-06-20 DIAGNOSIS — F3132 Bipolar disorder, current episode depressed, moderate: Secondary | ICD-10-CM

## 2020-08-15 ENCOUNTER — Ambulatory Visit (INDEPENDENT_AMBULATORY_CARE_PROVIDER_SITE_OTHER): Payer: Medicare HMO | Admitting: Psychology

## 2020-08-15 DIAGNOSIS — F3132 Bipolar disorder, current episode depressed, moderate: Secondary | ICD-10-CM | POA: Diagnosis not present

## 2020-10-03 ENCOUNTER — Ambulatory Visit: Payer: Medicare HMO | Admitting: Psychology

## 2021-02-15 ENCOUNTER — Ambulatory Visit (INDEPENDENT_AMBULATORY_CARE_PROVIDER_SITE_OTHER): Payer: Medicare HMO | Admitting: Psychology

## 2021-02-15 DIAGNOSIS — F3132 Bipolar disorder, current episode depressed, moderate: Secondary | ICD-10-CM

## 2021-03-08 ENCOUNTER — Ambulatory Visit: Payer: Medicare HMO | Admitting: Psychology

## 2021-04-01 ENCOUNTER — Ambulatory Visit (INDEPENDENT_AMBULATORY_CARE_PROVIDER_SITE_OTHER): Payer: Medicare HMO | Admitting: Psychology

## 2021-04-01 DIAGNOSIS — F3132 Bipolar disorder, current episode depressed, moderate: Secondary | ICD-10-CM

## 2021-06-03 ENCOUNTER — Ambulatory Visit (INDEPENDENT_AMBULATORY_CARE_PROVIDER_SITE_OTHER): Payer: Medicare HMO | Admitting: Psychology

## 2021-06-03 DIAGNOSIS — F3132 Bipolar disorder, current episode depressed, moderate: Secondary | ICD-10-CM | POA: Diagnosis not present

## 2021-07-22 ENCOUNTER — Ambulatory Visit: Payer: Medicare HMO | Admitting: Psychology

## 2021-12-17 ENCOUNTER — Ambulatory Visit (INDEPENDENT_AMBULATORY_CARE_PROVIDER_SITE_OTHER): Payer: Medicare HMO | Admitting: Psychology

## 2021-12-17 DIAGNOSIS — F4310 Post-traumatic stress disorder, unspecified: Secondary | ICD-10-CM | POA: Diagnosis not present

## 2021-12-17 DIAGNOSIS — F319 Bipolar disorder, unspecified: Secondary | ICD-10-CM | POA: Diagnosis not present
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
## 2021-12-26 ENCOUNTER — Ambulatory Visit: Payer: Medicare HMO | Admitting: Psychology

## 2021-12-31 ENCOUNTER — Ambulatory Visit (INDEPENDENT_AMBULATORY_CARE_PROVIDER_SITE_OTHER): Payer: Medicare HMO | Admitting: Psychology

## 2021-12-31 DIAGNOSIS — F4310 Post-traumatic stress disorder, unspecified: Secondary | ICD-10-CM | POA: Diagnosis not present

## 2021-12-31 DIAGNOSIS — F319 Bipolar disorder, unspecified: Secondary | ICD-10-CM
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
## 2022-01-07 ENCOUNTER — Ambulatory Visit (INDEPENDENT_AMBULATORY_CARE_PROVIDER_SITE_OTHER): Payer: Medicare HMO | Admitting: Psychology

## 2022-01-07 DIAGNOSIS — F319 Bipolar disorder, unspecified: Secondary | ICD-10-CM

## 2022-01-07 DIAGNOSIS — F4310 Post-traumatic stress disorder, unspecified: Secondary | ICD-10-CM
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
## 2022-01-14 ENCOUNTER — Ambulatory Visit: Payer: Medicare HMO | Admitting: Psychology

## 2022-01-21 ENCOUNTER — Ambulatory Visit: Payer: Medicare HMO | Admitting: Psychology

## 2022-02-04 ENCOUNTER — Ambulatory Visit (INDEPENDENT_AMBULATORY_CARE_PROVIDER_SITE_OTHER): Payer: Medicare HMO | Admitting: Psychology

## 2022-02-04 DIAGNOSIS — F4310 Post-traumatic stress disorder, unspecified: Secondary | ICD-10-CM | POA: Diagnosis not present

## 2022-02-04 DIAGNOSIS — F319 Bipolar disorder, unspecified: Secondary | ICD-10-CM | POA: Diagnosis not present
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
## 2022-02-12 ENCOUNTER — Ambulatory Visit: Payer: Medicare HMO | Admitting: Psychology

## 2022-02-19 ENCOUNTER — Ambulatory Visit (INDEPENDENT_AMBULATORY_CARE_PROVIDER_SITE_OTHER): Payer: Medicare HMO | Admitting: Psychology

## 2022-02-19 DIAGNOSIS — F4310 Post-traumatic stress disorder, unspecified: Secondary | ICD-10-CM

## 2022-02-19 DIAGNOSIS — F319 Bipolar disorder, unspecified: Secondary | ICD-10-CM
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
## 2022-03-13 ENCOUNTER — Ambulatory Visit: Payer: Medicare HMO | Admitting: Psychology

## 2022-03-21 ENCOUNTER — Ambulatory Visit (INDEPENDENT_AMBULATORY_CARE_PROVIDER_SITE_OTHER): Payer: Medicare HMO | Admitting: Psychology

## 2022-03-21 DIAGNOSIS — F4310 Post-traumatic stress disorder, unspecified: Secondary | ICD-10-CM | POA: Diagnosis not present

## 2022-03-21 DIAGNOSIS — F319 Bipolar disorder, unspecified: Secondary | ICD-10-CM | POA: Diagnosis not present
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
## 2022-05-14 ENCOUNTER — Ambulatory Visit: Payer: Medicare HMO | Admitting: Psychology

## 2022-06-12 ENCOUNTER — Ambulatory Visit (INDEPENDENT_AMBULATORY_CARE_PROVIDER_SITE_OTHER): Payer: Medicare HMO | Admitting: Psychology

## 2022-06-12 DIAGNOSIS — F4310 Post-traumatic stress disorder, unspecified: Secondary | ICD-10-CM

## 2022-06-12 DIAGNOSIS — F319 Bipolar disorder, unspecified: Secondary | ICD-10-CM

## 2022-06-12 DIAGNOSIS — F4323 Adjustment disorder with mixed anxiety and depressed mood: Secondary | ICD-10-CM

## 2022-06-12 NOTE — Progress Notes (Signed)
[place] Counselor/Therapist Progress Note  Patient ID: Rebecca Floyd, MRN: [id number],    Date: 00/21/2023  Time Spent: 60 minutes  Treatment Type: Individual Therapy  Reported Symptoms: depression, flashbacks, dissociation,   Mental Status Exam: Appearance:  Casual     Behavior: Appropriate  Motor: Normal  Speech/Language:  Normal Rate  Affect: Blunt  Mood: depressed  Thought process: normal  Thought content:   WNL  Sensory/Perceptual disturbances:   WNL  Orientation: oriented to person, place, time/date, and situation  Attention: Good  Concentration: Good  Memory: WNL  Fund of knowledge:  Good  Insight:   Good  Judgment:  Fair  Impulse Control: Fair   Risk Assessment: Danger to Self:  No Self-injurious Behavior: No Danger to Others: No Duty to Warn:no Physical Aggression / Violence:No  Access to Firearms a concern: No  Gang Involvement:No   Subjective: The patient came in today for a face-to-face individual therapy session via video visit.  The patient gave consent for the session to be on caregility.  The patient was in her home alone and therapist was in the office.  The patient presents as depressed.  The patient reports that she is staying home and does not feel well because she has pneumonia.  The patient reports that she has something on her lung and is supposed to go to a doctor and October to have this evaluated.  The patient is very concerned that it could be cancer because her family members died of cancer.  She asked if I was going to be there if she had a problem and I explained to her that I would help support her through what ever she is going through.  I recommended to her that she be proactive in coming up with a plan of what she is going to do if for some reason it is cancer in terms of making sure that everything is set up for her husband and children.  The patient reports that her trailer is only in her name and her children's name  and they could question her husband being able to live there.  I gave her some resources to look into to help her get those things organized between now and her next visit.  I told the patient that we need to stay in the now and have  good positive thoughts as we can.  Provided supportive therapy and cognitive behavioral therapy during this session.  Interventions: Cognitive Behavioral Therapy, Eye Movement Desensitization and Reprocessing (EMDR), and Insight-Oriented  Diagnosis:Bipolar I disorder (HCC)  PTSD (post-traumatic stress disorder)  Adjustment disorder with mixed anxiety and depressed mood  Plan: Client Abilities/Strengths  supportive husband, intelligent, ability for insight  Client Treatment Preferences  Outpatient Individual therapy  Client Statement of Needs  " I need some help with managing my bipolar and with my flashbacks."  Treatment Level  Outpatient Individual therapy  Symptoms  Demonstrates an exaggerated startle response.:  (Status: improved. Depressed or irritable mood.:  (Status: maintained). Describes a reliving of the event,  particularly through dissociative flashbacks.: (Status: improved. Diminished  interest in or enjoyment of activities.: No Description Entered (Status: maintained). Displays significant psychological and/or physiological distress resulting from internal and external clues that are  reminiscent of the traumatic event.:  (Status:improved). Experiences  disturbing and persistent thoughts, images, and/or perceptions of the traumatic event.:(Status:improved). Has been exposed to a traumatic event involving actual or perceived  threat of death or serious injury.:  (Status: maintained). Impairment in social,  occupational, or other areas of functioning.: (Status: improved). Intentionally  avoids thoughts, feelings, or discussions related to the traumatic event.:  (Status: improved). Lack of energy.: (Status: maintained). Low self-esteem.:    (Status: maintained). Reports response of intense fear, helplessness, or horror  to the traumatic event.:  (Status: improved). Social withdrawal.: No  Description Entered (Status: maintained).  Problems Addressed  Bipolar Disorder - Depression, Posttraumatic Stress Disorder (PTSD), Goals 1. Develop healthy thinking patterns and beliefs about self, others, and the world that lead to the alleviation and help prevent the relapse of  depression. Objective Take prescribed medications as directed. Target Date: 2022-12-18 Frequency: Weekly Progress: 0 Modality: individual Related Interventions 1. Monitor the client for psychotropic medication prescription compliance, side effects, and  effectiveness. 2. Monitor the client's symptom improvement toward stabilization sufficient to allow participation in psychotherapy. 2. Eliminate or reduce the negative impact trauma related symptoms have  on social, occupational, and family functioning. Objective Participate in Eye Movement Desensitization and Reprocessing (EMDR) to reduce emotional distress  related to traumatic thoughts, feelings, and images. Target Date: 2022-12-18 Frequency: Weekly Progress: 0 Modality: individual Related Interventions 1. Utilize Eye Movement Desensitization and Reprocessing (EMDR) to reduce the client's  emotional reactivity to the traumatic event and reduce PTSD symptoms. 3. No longer avoids persons, places, activities, and objects that are  reminiscent of the traumatic event. 4. Thinks about or openly discusses the traumatic event with others  without experiencing psychological or physiological distress. Diagnosis F43.10 (Posttraumatic stress disorder) - Open - [Signifier: n/a] Posttraumatic Stress  Disorder  Bipolar I disorder Conditions For Discharge Achievement of treatment goals and objectives  The patient has approved this plan.  Will see her weekly through the EMDR process and then move appointments further  out.  Meryem Haertel G Sabriyah Wilcher, LCSW

## 2022-07-18 ENCOUNTER — Ambulatory Visit (INDEPENDENT_AMBULATORY_CARE_PROVIDER_SITE_OTHER): Payer: Medicare HMO | Admitting: Psychology

## 2022-07-18 DIAGNOSIS — F4310 Post-traumatic stress disorder, unspecified: Secondary | ICD-10-CM

## 2022-07-18 DIAGNOSIS — F319 Bipolar disorder, unspecified: Secondary | ICD-10-CM

## 2022-07-18 DIAGNOSIS — F4323 Adjustment disorder with mixed anxiety and depressed mood: Secondary | ICD-10-CM

## 2022-07-18 NOTE — Progress Notes (Signed)
                Ashante Yellin G Woodfin Kiss, LCSW 

## 2022-09-30 ENCOUNTER — Ambulatory Visit: Payer: Medicare HMO | Admitting: Psychology

## 2023-02-26 ENCOUNTER — Ambulatory Visit (INDEPENDENT_AMBULATORY_CARE_PROVIDER_SITE_OTHER): Payer: Medicare HMO | Admitting: Psychology

## 2023-02-26 DIAGNOSIS — F319 Bipolar disorder, unspecified: Secondary | ICD-10-CM | POA: Diagnosis not present

## 2023-02-26 DIAGNOSIS — F4310 Post-traumatic stress disorder, unspecified: Secondary | ICD-10-CM | POA: Diagnosis not present

## 2023-02-26 DIAGNOSIS — F4323 Adjustment disorder with mixed anxiety and depressed mood: Secondary | ICD-10-CM

## 2023-02-26 NOTE — Progress Notes (Signed)
[facility] Counselor/Therapist Progress Note  Patient ID: Rebecca Floyd, MRN: [id number],    Date: 00/21/2023  Time Spent: 60 minutes  Treatment Type: Individual Therapy  Reported Symptoms: depression, flashbacks, dissociation,   Mental Status Exam: Appearance:  Casual     Behavior: Appropriate  Motor: Normal  Speech/Language:  Normal Rate  Affect: Blunt  Mood: depressed  Thought process: normal  Thought content:   WNL  Sensory/Perceptual disturbances:   WNL  Orientation: oriented to person, place, time/date, and situation  Attention: Good  Concentration: Good  Memory: WNL  Fund of knowledge:  Good  Insight:   Good  Judgment:  Fair  Impulse Control: Fair   Risk Assessment: Danger to Self:  No Self-injurious Behavior: No Danger to Others: No Duty to Warn:no Physical Aggression / Violence:No  Access to Firearms a concern: No  Gang Involvement:No   Subjective: The patient came in today for a face-to-face individual therapy session via video visit.  The patient gave consent for the session to be on caregility.  The patient was in her home alone and therapist was in the office.  The patient presents as depressed.  The patient reports that she is staying home and does not feel well because she has pneumonia.  The patient reports that she has something on her lung and is supposed to go to a doctor and October to have this evaluated.  The patient is very concerned that it could be cancer because her family members died of cancer.  She asked if I was going to be there if she had a problem and I explained to her that I would help support her through what ever she is going through.  I recommended to her that she be proactive in coming up with a plan of what she is going to do if for some reason it is cancer in terms of making sure that everything is set up for her husband and children.  The patient reports  that her trailer is only in her name and her children's name and they could question her husband being able to live there.  I gave her some resources to look into to help her get those things organized between now and her next visit.  I told the patient that we need to stay in the now and have  good positive thoughts as we can.  Provided supportive therapy and cognitive behavioral therapy during this session.  Interventions: Cognitive Behavioral Therapy, Eye Movement Desensitization and Reprocessing (EMDR), and Insight-Oriented  Diagnosis:Bipolar I disorder (HCC)  PTSD (post-traumatic stress disorder)  Adjustment disorder with mixed anxiety and depressed mood  Plan: Client Abilities/Strengths  supportive husband, intelligent, ability for insight  Client Treatment Preferences  Outpatient Individual therapy  Client Statement of Needs  " I need some help with managing my bipolar and with my flashbacks."  Treatment Level  Outpatient Individual therapy  Symptoms  Demonstrates an exaggerated startle response.:  (Status: improved. Depressed or irritable mood.:  (Status: maintained). Describes a reliving of the event,  particularly through dissociative flashbacks.: (Status: improved. Diminished  interest in or enjoyment of activities.: No Description Entered (Status: maintained). Displays significant psychological and/or physiological distress resulting from internal and external clues that are  reminiscent of the traumatic event.:  (Status:improved). Experiences  disturbing and persistent thoughts, images,  and/or perceptions of the traumatic event.:(Status:improved). Has been exposed to a traumatic event involving actual or perceived  threat of death or serious injury.:  (Status: maintained). Impairment in social,  occupational, or other areas of functioning.: (Status: improved). Intentionally  avoids thoughts, feelings, or discussions related to the traumatic event.:  (Status: improved). Lack  of energy.: (Status: maintained). Low self-esteem.:   (Status: maintained). Reports response of intense fear, helplessness, or horror  to the traumatic event.:  (Status: improved). Social withdrawal.: No  Description Entered (Status: maintained).  Problems Addressed  Bipolar Disorder - Depression, Posttraumatic Stress Disorder (PTSD), Goals 1. Develop healthy thinking patterns and beliefs about self, others, and the world that lead to the alleviation and help prevent the relapse of  depression. Objective Take prescribed medications as directed. Target Date: 2022-12-18 Frequency: Weekly Progress: 0 Modality: individual Related Interventions 1. Monitor the client for psychotropic medication prescription compliance, side effects, and  effectiveness. 2. Monitor the client's symptom improvement toward stabilization sufficient to allow participation in psychotherapy. 2. Eliminate or reduce the negative impact trauma related symptoms have  on social, occupational, and family functioning. Objective Participate in Eye Movement Desensitization and Reprocessing (EMDR) to reduce emotional distress  related to traumatic thoughts, feelings, and images. Target Date: 2022-12-18 Frequency: Weekly Progress: 0 Modality: individual Related Interventions 1. Utilize Eye Movement Desensitization and Reprocessing (EMDR) to reduce the client's  emotional reactivity to the traumatic event and reduce PTSD symptoms. 3. No longer avoids persons, places, activities, and objects that are  reminiscent of the traumatic event. 4. Thinks about or openly discusses the traumatic event with others  without experiencing psychological or physiological distress. Diagnosis F43.10 (Posttraumatic stress disorder) - Open - [Signifier: n/a] Posttraumatic Stress  Disorder  Bipolar I disorder Conditions For Discharge Achievement of treatment goals and objectives  The patient has approved this plan.  Will see her weekly  through the EMDR process and then move appointments further out.   [facility] Counselor/Therapist Progress Note  Patient ID: Rebecca Floyd, MRN: [id number],    Date: 02/26/2023  Time Spent: 50 minutes  Treatment Type: Individual Therapy  Reported Symptoms: depression, flashbacks, dissociation,   Mental Status Exam: Appearance:  Casual     Behavior: Appropriate  Motor: Normal  Speech/Language:  Normal Rate  Affect: Blunt  Mood: depressed  Thought process: normal  Thought content:   WNL  Sensory/Perceptual disturbances:   WNL  Orientation: oriented to person, place, time/date, and situation  Attention: Good  Concentration: Good  Memory: WNL  Fund of knowledge:  Good  Insight:   Good  Judgment:  Fair  Impulse Control: Fair   Risk Assessment: Danger to Self:  No Self-injurious Behavior: No Danger to Others: No Duty to Warn:no Physical Aggression / Violence:No  Access to Firearms a concern: No  Gang Involvement:No   Subjective: The patient was seen today for a face-to-face individual therapy session via video visit.  The patient gave consent for the session to be on caregility.  The patient was in her home alone and therapist was in the office.  The patient presents as anxious and depressed.  The patient reports that she has been very stressed out about her health.  She reports that they found the spots on her lungs but they also found some spots on her kidney and she has an enlarged heart.  She reports that she was having some nausea and vomiting and shortness of breath and they went to the hospital and apparently it was because of  her heart.  I reframed this for her and that is a good thing that she had those experiences because otherwise she may not have gone to the hospital.  We talked about how to deal with this lady and followed because she is living in uncertainty.  We talked about not using any what if statements to herself or others.  We discussed staying  in the present moment and getting herself neutral or positive statements such as I am okay now and I can handle this.  I explained the importance of her self talk and that this would be okay for her feeling as normal as she can from the circumstance.  In addition we talked about the little girl that she keeps sometimes who is 55 years old and that things that this little girl is doing some caregiving of her.  I explained that we want to make sure that the little girl is not taking on too much responsibility for her at her age.  The patient understood the concepts discussed. Interventions: Cognitive Behavioral Therapy, Eye Movement Desensitization and Reprocessing (EMDR), and Insight-Oriented  Diagnosis:Bipolar I disorder (HCC)  PTSD (post-traumatic stress disorder)  Adjustment disorder with mixed anxiety and depressed mood  Plan: Client Abilities/Strengths  supportive husband, intelligent, ability for insight  Client Treatment Preferences  Outpatient Individual therapy  Client Statement of Needs  " I need some help with managing my bipolar and with my flashbacks."  Treatment Level  Outpatient Individual therapy  Symptoms  Demonstrates an exaggerated startle response.:  (Status: improved. Depressed or irritable mood.:  (Status: maintained). Describes a reliving of the event,  particularly through dissociative flashbacks.: (Status: improved. Diminished  interest in or enjoyment of activities.: No Description Entered (Status: maintained). Displays significant psychological and/or physiological distress resulting from internal and external clues that are  reminiscent of the traumatic event.:  (Status:improved). Experiences  disturbing and persistent thoughts, images, and/or perceptions of the traumatic event.:(Status:improved). Has been exposed to a traumatic event involving actual or perceived  threat of death or serious injury.:  (Status: maintained). Impairment in social,  occupational, or  other areas of functioning.: (Status: improved). Intentionally  avoids thoughts, feelings, or discussions related to the traumatic event.:  (Status: improved). Lack of energy.: (Status: maintained). Low self-esteem.:   (Status: maintained). Reports response of intense fear, helplessness, or horror  to the traumatic event.:  (Status: improved). Social withdrawal.: No  Description Entered (Status: maintained).  Problems Addressed  Bipolar Disorder - Depression, Posttraumatic Stress Disorder (PTSD), Goals 1. Develop healthy thinking patterns and beliefs about self, others, and the world that lead to the alleviation and help prevent the relapse of  depression. Objective Take prescribed medications as directed. Target Date: 2023-12-18 Frequency: Weekly Progress: 0 Modality: individual Related Interventions 1. Monitor the client for psychotropic medication prescription compliance, side effects, and  effectiveness. 2. Monitor the client's symptom improvement toward stabilization sufficient to allow participation in psychotherapy. 2. Eliminate or reduce the negative impact trauma related symptoms have  on social, occupational, and family functioning. Objective Participate in Eye Movement Desensitization and Reprocessing (EMDR) to reduce emotional distress  related to traumatic thoughts, feelings, and images. Target Date: 2023-12-18 Frequency: Weekly Progress: 50 Modality: individual Related Interventions 1. Utilize Eye Movement Desensitization and Reprocessing (EMDR) to reduce the client's  emotional reactivity to the traumatic event and reduce PTSD symptoms. 3. No longer avoids persons, places, activities, and objects that are  reminiscent of the traumatic event. 4. Thinks about or openly discusses the traumatic event with  others  without experiencing psychological or physiological distress. Diagnosis F43.10 (Posttraumatic stress disorder) - Open - [Signifier: n/a] Posttraumatic Stress   Disorder  Bipolar I disorder Conditions For Discharge Achievement of treatment goals and objectives  The patient has approved this plan.    Amorah Sebring G. Kurtiss Wence, LCSW

## 2023-03-12 ENCOUNTER — Ambulatory Visit (INDEPENDENT_AMBULATORY_CARE_PROVIDER_SITE_OTHER): Payer: Medicare HMO | Admitting: Psychology

## 2023-03-12 DIAGNOSIS — F319 Bipolar disorder, unspecified: Secondary | ICD-10-CM | POA: Diagnosis not present

## 2023-03-12 DIAGNOSIS — F4323 Adjustment disorder with mixed anxiety and depressed mood: Secondary | ICD-10-CM

## 2023-03-12 DIAGNOSIS — F4310 Post-traumatic stress disorder, unspecified: Secondary | ICD-10-CM

## 2023-03-12 NOTE — Progress Notes (Addendum)
[facility] Counselor/Therapist Progress Note  Patient ID: Rebecca Floyd, MRN: [id number],    Date: 03/12/2023  Time Spent: 30 minutes  Time in:  1:00  Time out:1:30  Treatment Type: Individual Therapy  Reported Symptoms: depression, flashbacks, dissociation,   Mental Status Exam: Appearance:  Casual     Behavior: Appropriate  Motor: Normal  Speech/Language:  Normal Rate  Affect: Blunt  Mood: pleasant  Thought process: normal  Thought content:   WNL  Sensory/Perceptual disturbances:   WNL  Orientation: oriented to person, place, time/date, and situation  Attention: Good  Concentration: Good  Memory: WNL  Fund of knowledge:  Good  Insight:   Good  Judgment:  Fair  Impulse Control: Fair   Risk Assessment: Danger to Self:  No Self-injurious Behavior: No Danger to Others: No Duty to Warn:no Physical Aggression / Violence:No  Access to Firearms a concern: No  Gang Involvement:No   Subjective: The patient was seen today for a face-to-face individual therapy session via video visit.  The patient gave consent for the session to be on caregility and is aware of the limitations of telehealth.  The patient was in her home alone and therapist was in the office.  The patient presents as pleasant and cooperative.  She reports that she has a biopsy tomorrow on her kidneys.  We talked about how she is managing things and she reports that she has been doing what we talked about and trying to be more neutral or positive about the circumstance or saying things to herself in a supportive sort of way.  The patient feels like she is handling things relatively well.  She says that her husband is going with her tomorrow for the biopsy and they are going to put her to sleep and then we should have more answers by next week.  The patient seems to be handling things okay and I gave her some supportive statements that she can say to herself so that she can cope.  We have an appointment  in 2 weeks where we can talk about what the results of the test are and decide how we want to move forward.  Interventions: Cognitive Behavioral Therapy, Eye Movement Desensitization and Reprocessing (EMDR), and Insight-Oriented  Diagnosis:Bipolar I disorder (HCC)  PTSD (post-traumatic stress disorder)  Adjustment disorder with mixed anxiety and depressed mood  Plan: Client Abilities/Strengths  supportive husband, intelligent, ability for insight  Client Treatment Preferences  Outpatient Individual therapy  Client Statement of Needs  " I need some help with managing my bipolar and with my flashbacks."  Treatment Level  Outpatient Individual therapy  Symptoms  Demonstrates an exaggerated startle response.:  (Status: improved. Depressed or irritable mood.:  (Status: maintained). Describes a reliving of the event,  particularly through dissociative flashbacks.: (Status: improved. Diminished  interest in or enjoyment of activities.: No Description Entered (Status: maintained). Displays significant psychological and/or physiological distress resulting from internal and external clues that are  reminiscent of the traumatic event.:  (Status:improved). Experiences  disturbing and persistent thoughts, images, and/or perceptions of the traumatic event.:(Status:improved). Has been exposed to a traumatic event involving actual or perceived  threat of death or serious injury.:  (Status: maintained). Impairment in social,  occupational, or other areas of functioning.: (Status: improved). Intentionally  avoids thoughts, feelings, or discussions related to the traumatic event.:  (Status: improved). Lack of energy.: (Status: maintained). Low self-esteem.:   (Status: maintained). Reports response of intense fear, helplessness, or horror  to the traumatic event.:  (Status:  improved). Social withdrawal.: No  Description Entered (Status: maintained).  Problems Addressed  Bipolar Disorder - Depression,  Posttraumatic Stress Disorder (PTSD), Goals 1. Develop healthy thinking patterns and beliefs about self, others, and the world that lead to the alleviation and help prevent the relapse of  depression. Objective Take prescribed medications as directed. Target Date: 2023-12-18 Frequency: Weekly Progress: 10 Modality: individual Related Interventions 1. Monitor the client for psychotropic medication prescription compliance, side effects, and  effectiveness. 2. Monitor the client's symptom improvement toward stabilization sufficient to allow participation in psychotherapy. 2. Eliminate or reduce the negative impact trauma related symptoms have  on social, occupational, and family functioning. Objective Participate in Eye Movement Desensitization and Reprocessing (EMDR) to reduce emotional distress  related to traumatic thoughts, feelings, and images. Target Date: 2023-12-18 Frequency: Weekly Progress: 50 Modality: individual Related Interventions 1. Utilize Eye Movement Desensitization and Reprocessing (EMDR) to reduce the client's  emotional reactivity to the traumatic event and reduce PTSD symptoms. 3. No longer avoids persons, places, activities, and objects that are  reminiscent of the traumatic event. 4. Thinks about or openly discusses the traumatic event with others  without experiencing psychological or physiological distress. Diagnosis F43.10 (Posttraumatic stress disorder) - Open - [Signifier: n/a] Posttraumatic Stress  Disorder  Bipolar I disorder Conditions For Discharge Achievement of treatment goals and objectives  The patient has approved this plan.    De Libman G. Amadi Frady, LCSW

## 2023-03-24 ENCOUNTER — Ambulatory Visit (INDEPENDENT_AMBULATORY_CARE_PROVIDER_SITE_OTHER): Payer: Medicare HMO | Admitting: Psychology

## 2023-03-24 DIAGNOSIS — F4323 Adjustment disorder with mixed anxiety and depressed mood: Secondary | ICD-10-CM | POA: Diagnosis not present

## 2023-03-24 DIAGNOSIS — F319 Bipolar disorder, unspecified: Secondary | ICD-10-CM

## 2023-03-24 DIAGNOSIS — F4310 Post-traumatic stress disorder, unspecified: Secondary | ICD-10-CM | POA: Diagnosis not present

## 2023-03-24 NOTE — Progress Notes (Signed)
                Kerman Pfost G Skylene Deremer, LCSW

## 2023-05-05 ENCOUNTER — Ambulatory Visit (INDEPENDENT_AMBULATORY_CARE_PROVIDER_SITE_OTHER): Payer: Medicare HMO | Admitting: Psychology

## 2023-05-05 DIAGNOSIS — F4310 Post-traumatic stress disorder, unspecified: Secondary | ICD-10-CM | POA: Diagnosis not present

## 2023-05-05 DIAGNOSIS — F319 Bipolar disorder, unspecified: Secondary | ICD-10-CM

## 2023-05-05 DIAGNOSIS — F4323 Adjustment disorder with mixed anxiety and depressed mood: Secondary | ICD-10-CM | POA: Diagnosis not present

## 2023-05-05 NOTE — Progress Notes (Unsigned)
                Verdelle Valtierra G Mao Lockner, LCSW

## 2023-07-01 ENCOUNTER — Ambulatory Visit: Payer: Medicare HMO | Admitting: Psychology

## 2023-07-01 DIAGNOSIS — F319 Bipolar disorder, unspecified: Secondary | ICD-10-CM | POA: Diagnosis not present

## 2023-07-01 DIAGNOSIS — F4310 Post-traumatic stress disorder, unspecified: Secondary | ICD-10-CM

## 2023-07-01 DIAGNOSIS — F4323 Adjustment disorder with mixed anxiety and depressed mood: Secondary | ICD-10-CM | POA: Diagnosis not present

## 2023-07-01 NOTE — Progress Notes (Signed)
[facility] Counselor/Therapist Progress Note  Patient ID: Rebecca Floyd, MRN: [id number],    Date: 07/01/2023  Time Spent:  45 minutes  Time in:  1:00  Time out: 1:45  Treatment Type: Individual Therapy  Reported Symptoms: depression, flashbacks, dissociation,   Mental Status Exam: Appearance:  Casual     Behavior: Appropriate  Motor: Normal  Speech/Language:  Normal Rate  Affect: Blunt  Mood: pleasant  Thought process: normal  Thought content:   WNL  Sensory/Perceptual disturbances:   WNL  Orientation: oriented to person, place, time/date, and situation  Attention: Good  Concentration: Good  Memory: WNL  Fund of knowledge:  Good  Insight:   Good  Judgment:  Fair  Impulse Control: Fair   Risk Assessment: Danger to Self:  No Self-injurious Behavior: No Danger to Others: No Duty to Warn:no Physical Aggression / Violence:No  Access to Firearms a concern: No  Gang Involvement:No   Subjective: The patient was seen today for a face-to-face individual therapy session via video visit.  The patient gave consent for the session to be on caregility and is aware of the limitations of telehealth.  The patient was in her home alone and therapist was in the office.  The patient presents as pleasant and cooperative.  The patient reports that she has a really bad toothache.  She states that it is going to the house $1700 to get it fixed and they do not have it.  We did some problem solving and I recommended that she reapply for Medicaid as she might be eligible for some assistance through the Medicaid expansion.  I told her that if she could not get Medicaid then we would need to do some more problem solving and that she probably should contact me and we could do some brainstorming about maybe what kind of other options might be available.  I told her that I was concerned about the toothache getting infected and being so close to her brain.  She says that she is going to  call and see if she can get on Medicaid and hopefully that will happen so that she can get this tooth removed soon.  Scheduled another appointment for a few months.  Interventions: Cognitive Behavioral Therapy, Eye Movement Desensitization and Reprocessing (EMDR), and Insight-Oriented, problem solving  Diagnosis:Bipolar I disorder (HCC)  PTSD (post-traumatic stress disorder)  Adjustment disorder with mixed anxiety and depressed mood  Plan: Client Abilities/Strengths  supportive husband, intelligent, ability for insight  Client Treatment Preferences  Outpatient Individual therapy  Client Statement of Needs  " I need some help with managing my bipolar and with my flashbacks."  Treatment Level  Outpatient Individual therapy  Symptoms  Demonstrates an exaggerated startle response.:  (Status: improved. Depressed or irritable mood.:  (Status: maintained). Describes a reliving of the event,  particularly through dissociative flashbacks.: (Status: improved. Diminished  interest in or enjoyment of activities.: No Description Entered (Status: maintained). Displays significant psychological and/or physiological distress resulting from internal and external clues that are  reminiscent of the traumatic event.:  (Status:improved). Experiences  disturbing and persistent thoughts, images, and/or perceptions of the traumatic event.:(Status:improved). Has been exposed to a traumatic event involving actual or perceived  threat of death or serious injury.:  (Status: maintained). Impairment in social,  occupational, or other areas of functioning.: (Status: improved). Intentionally  avoids thoughts, feelings, or discussions related to the traumatic event.:  (Status: improved). Lack of energy.: (Status: maintained). Low self-esteem.:   (Status: maintained). Reports response of intense  fear, helplessness, or horror  to the traumatic event.:  (Status: improved). Social withdrawal.: No  Description Entered  (Status: maintained).  Problems Addressed  Bipolar Disorder - Depression, Posttraumatic Stress Disorder (PTSD), Goals 1. Develop healthy thinking patterns and beliefs about self, others, and the world that lead to the alleviation and help prevent the relapse of  depression. Objective Take prescribed medications as directed. Target Date: 2023-12-18 Frequency:  every 4-8 weeks Progress: 30 Modality: individual Related Interventions 1. Monitor the client for psychotropic medication prescription compliance, side effects, and  effectiveness. 2. Monitor the client's symptom improvement toward stabilization sufficient to allow participation in psychotherapy. 2. Eliminate or reduce the negative impact trauma related symptoms have  on social, occupational, and family functioning. Objective Participate in Eye Movement Desensitization and Reprocessing (EMDR) to reduce emotional distress  related to traumatic thoughts, feelings, and images. Target Date: 2023-12-18 Frequency: every 4-8 weeks Progress: 60 Modality: individual Related Interventions 1. Utilize Eye Movement Desensitization and Reprocessing (EMDR) to reduce the client's  emotional reactivity to the traumatic event and reduce PTSD symptoms. 3. No longer avoids persons, places, activities, and objects that are  reminiscent of the traumatic event. 4. Thinks about or openly discusses the traumatic event with others  without experiencing psychological or physiological distress. Diagnosis F43.10 (Posttraumatic stress disorder) - Open - [Signifier: n/a] Posttraumatic Stress  Disorder  Bipolar I disorder Conditions For Discharge Achievement of treatment goals and objectives  The patient has approved this plan.    Akaila Rambo G. Bairon Klemann, LCSW

## 2023-08-14 ENCOUNTER — Ambulatory Visit: Payer: Medicare HMO | Admitting: Psychology

## 2023-08-14 NOTE — Progress Notes (Unsigned)
Rebecca Floyd G Rebecca Vercher, LCSW

## 2023-11-20 ENCOUNTER — Ambulatory Visit (INDEPENDENT_AMBULATORY_CARE_PROVIDER_SITE_OTHER): Payer: Medicare HMO | Admitting: Psychology

## 2023-11-20 DIAGNOSIS — F319 Bipolar disorder, unspecified: Secondary | ICD-10-CM

## 2023-11-20 DIAGNOSIS — F4310 Post-traumatic stress disorder, unspecified: Secondary | ICD-10-CM
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
## 2024-01-22 ENCOUNTER — Ambulatory Visit: Admitting: Psychology

## 2024-02-04 ENCOUNTER — Ambulatory Visit (INDEPENDENT_AMBULATORY_CARE_PROVIDER_SITE_OTHER): Admitting: Psychology

## 2024-02-04 DIAGNOSIS — F4310 Post-traumatic stress disorder, unspecified: Secondary | ICD-10-CM

## 2024-02-04 DIAGNOSIS — F319 Bipolar disorder, unspecified: Secondary | ICD-10-CM

## 2024-02-04 NOTE — Progress Notes (Signed)
  [facility] Counselor/Therapist Progress Note  Patient ID: Rebecca Floyd, MRN: [id number],    Date: 02/04/2024  Time Spent:  49 minutes  Time in:  4:09  Time out: 4:58  Treatment Type: Individual Therapy  Reported Symptoms: depression, flashbacks, dissociation,   Mental Status Exam: Appearance:  Casual     Behavior: Appropriate  Motor: Normal  Speech/Language:  Normal Rate  Affect: Blunt  Mood: pleasant  Thought process: normal  Thought content:   WNL  Sensory/Perceptual disturbances:   WNL  Orientation: oriented to person, place, time/date, and situation  Attention: Good  Concentration: Good  Memory: WNL  Fund of knowledge:  Good  Insight:   Good  Judgment:  Fair  Impulse Control: Fair   Risk Assessment: Danger to Self:  No Self-injurious Behavior: No Danger to Others: No Duty to Warn:no Physical Aggression / Violence:No  Access to Firearms a concern: No  Gang Involvement:No   Subjective: The patient was seen today for a face-to-face individual therapy session via video visit.  The patient gave consent for the session to be on caregility and is aware of the limitations of telehealth.  The patient was in her home alone and therapist was in the office.  The patient presents as pleasant and cooperative.  The patient states that she had surgery on her arm last week.  She says that she had a knot on it and they removed it.  She said that she will be out of commission for about 2 weeks.  She reports that she feels okay emotionally.  She continues to take care of her adopted granddaughter and that seems to make her happy.     Interventions: Cognitive Behavioral Therapy, Eye Movement Desensitization and Reprocessing (EMDR), and Insight-Oriented, problem solving  Diagnosis:Bipolar I disorder (HCC)  PTSD (post-traumatic stress disorder)  Plan: Client Abilities/Strengths  supportive husband, intelligent, ability for insight  Client Treatment Preferences   Outpatient Individual therapy  Client Statement of Needs  " I need some help with managing my bipolar and with my flashbacks."  Treatment Level  Outpatient Individual therapy  Symptoms  Demonstrates an exaggerated startle response.:  (Status: improved. Depressed or irritable mood.:  (Status: maintained). Describes a reliving of the event,  particularly through dissociative flashbacks.: (Status: improved. Diminished  interest in or enjoyment of activities.: No Description Entered (Status: maintained). Displays significant psychological and/or physiological distress resulting from internal and external clues that are  reminiscent of the traumatic event.:  (Status:improved). Experiences  disturbing and persistent thoughts, images, and/or perceptions of the traumatic event.:(Status:improved). Has been exposed to a traumatic event involving actual or perceived  threat of death or serious injury.:  (Status: maintained). Impairment in social,  occupational, or other areas of functioning.: (Status: improved). Intentionally  avoids thoughts, feelings, or discussions related to the traumatic event.:  (Status: improved). Lack of energy.: (Status: maintained). Low self-esteem.:   (Status: maintained). Reports response of intense fear, helplessness, or horror  to the traumatic event.:  (Status: improved). Social withdrawal.: No  Description Entered (Status: maintained).  Problems Addressed  Bipolar Disorder - Depression, Posttraumatic Stress Disorder (PTSD), Goals 1. Develop healthy thinking patterns and beliefs about self, others, and the world that lead to the alleviation and help prevent the relapse of  depression. Objective Take prescribed medications as directed. Target Date: 2024-12-17 Frequency:  every 4-8 weeks Progress: 40 Modality: individual Related Interventions 1. Monitor the client for psychotropic medication prescription compliance, side effects, and  effectiveness. 2. Monitor  the client's symptom improvement toward  stabilization sufficient to allow participation in psychotherapy. 2. Eliminate or reduce the negative impact trauma related symptoms have  on social, occupational, and family functioning. Objective Participate in Eye Movement Desensitization and Reprocessing (EMDR) to reduce emotional distress  related to traumatic thoughts, feelings, and images. Target Date: 2024-12-17 Frequency: every 4-8 weeks Progress: 60 Modality: individual Related Interventions 1. Utilize Eye Movement Desensitization and Reprocessing (EMDR) to reduce the client's  emotional reactivity to the traumatic event and reduce PTSD symptoms. 3. No longer avoids persons, places, activities, and objects that are  reminiscent of the traumatic event. 4. Thinks about or openly discusses the traumatic event with others  without experiencing psychological or physiological distress. Diagnosis F43.10 (Posttraumatic stress disorder) - Open - [Signifier: n/a] Posttraumatic Stress  Disorder  Bipolar I disorder Conditions For Discharge Achievement of treatment goals and objectives  The patient has approved this plan.    Simpson Paulos G. Evelisse Szalkowski, LCSW                                                                                                               Dagmar Adcox G Stephanne Greeley, LCSW

## 2024-03-18 ENCOUNTER — Ambulatory Visit: Admitting: Psychology

## 2024-03-29 ENCOUNTER — Ambulatory Visit: Admitting: Psychology

## 2024-10-05 ENCOUNTER — Ambulatory Visit: Admitting: Psychology

## 2024-10-05 NOTE — Progress Notes (Unsigned)
                edge,  experiencing concentration difficulties, having trouble falling or staying asleep, exhibiting a general  state of irritability).: No Description Entered (Status: improved). Motor tension (e.g., restlessness,  tiredness, shakiness, muscle tension).: No Description Entered (Status: improved).  Problems Addressed  Anxiety, Phase Of Life Problems, Anxiety  Goals 1. Learn and implement coping skills that result in a reduction of anxiety  and worry, and improved daily functioning. Objective Learn  and implement calming skills to reduce overall anxiety and manage anxiety symptoms. Target Date: 2025-08-09Frequency: Weekly Progress: 40 Modality: individual  Related Interventions 1. Teach the client calming/relaxation skills (e.g., applied relaxation, progressive muscle  relaxation, cue controlled relaxation; mindful breathing; biofeedback) and how to discriminate  better between relaxation and tension; teach the client how to apply these skills to his/her daily  life (e.g., New Directions in Progressive Muscle Relaxation by Marcelyn Ditty, and  Hazlett-Stevens; Treating Generalized Anxiety Disorder by Rygh and Ida Rogue). Objective Identify, challenge, and replace biased, fearful self-talk with positive, realistic, and empowering selftalk. Target Date: 2024-04-30 Frequency: weekly Progress: 30 Modality: individual Related Interventions 1. Explore the client's schema and self-talk that mediate his/her fear response; assist him/her in  challenging the biases; replace the distorted messages with reality-based alternatives and  positive, realistic self-talk that will increase his/her self-confidence in coping with irrational  fears (see Cognitive Therapy of Anxiety Disorders by Laurence Slate). Objective Learn and implement problem-solving strategies for realistically addressing worries. Target Date: 2025-08-09Frequency: weekly Progress: 40 Modality: individual 2. Resolve conflicted feelings and adapt to the new life circumstances. Objective Apply problem-solving skills to current circumstances. Target Date: 2024-04-30 Frequency: weekly Progress: 20 Modality: individual Related Interventions 1. Teach the client problem-resolution skills (e.g., defining the problem clearly, brainstorming  multiple solutions, listing the pros and cons of each solution, seeking input from others,  selecting and implementing a plan of action, evaluating outcome, and readjusting plan as   necessary).   3. Stabilize anxiety level while increasing ability to function on a daily  basis. Diagnosis F33.1  Major depressive disorder, moderate 300.02 (Generalized anxiety disorder) - Open - [Signifier: n/a]  Axis  none 309.28 (Adjustment disorder with mixed anxiety and depressed  mood) - Open - [Signifier: n/a]  Adjustment Disorder,  With Anxiety   Marital conflict  Major Depressive disorder, moderate  Conditions For Discharge Achievement of treatment goals and objectives.  The patient approved this plan.   Deonna Krummel G Ethridge Sollenberger, LCSW

## 2024-10-13 ENCOUNTER — Ambulatory Visit: Admitting: Psychology

## 2024-10-13 DIAGNOSIS — F319 Bipolar disorder, unspecified: Secondary | ICD-10-CM

## 2024-10-13 DIAGNOSIS — F4310 Post-traumatic stress disorder, unspecified: Secondary | ICD-10-CM

## 2024-10-13 NOTE — Progress Notes (Signed)
 " [facility] Counselor/Therapist Progress Note  Patient ID: Rebecca Floyd, MRN: [id number],    Date: 10/13/2024  Time Spent:  46 minutes  Time in:  9:09  Time out: 9:55  Treatment Type: Individual Therapy  Reported Symptoms: depression, flashbacks, dissociation,   Mental Status Exam: Appearance:  Casual     Behavior: Appropriate  Motor: Normal  Speech/Language:  Normal Rate  Affect: Blunt  Mood: pleasant  Thought process: normal  Thought content:   WNL  Sensory/Perceptual disturbances:   WNL  Orientation: oriented to person, place, time/date, and situation  Attention: Good  Concentration: Good  Memory: WNL  Fund of knowledge:  Good  Insight:   Good  Judgment:  Fair  Impulse Control: Fair   Risk Assessment: Danger to Self:  No Self-injurious Behavior: No Danger to Others: No Duty to Warn:no Physical Aggression / Violence:No  Access to Firearms a concern: No  Gang Involvement:No   Subjective: The patient was seen today for a face-to-face individual therapy session via video visit.  The patient gave consent for the session to be on caregility and is aware of the limitations of telehealth.  The patient was in her home alone and therapist was in the office.  The patient presents as pleasant and cooperative.   The patient does present a bit with some slurred speech and she does not seem quite the same as she was the last time I saw her.  She says that she has been passing out some and she has not really seen the doctor for that yet and we talked about the need for her to.  I recommended that she contact the doctor and do something about that next week as is not normal to pass out on a regular basis.  She says she has done it about 3 or 4 times recently.  We also talked about what she is going to do about therapy after I leave and she just needs someone basically to check in with her on a periodic basis I recommended that she talk with her medication provider  tomorrow and see if there is anyone in her office that can do that for her.  I did schedule a few more sessions with her so that I can make sure that she does have someone if she wants to have someone to follow-up with after I retire.   Interventions: Cognitive Behavioral Therapy, Eye Movement Desensitization and Reprocessing (EMDR), and Insight-Oriented, problem solving  Diagnosis:Bipolar I disorder (HCC)  PTSD (post-traumatic stress disorder)  Plan: Client Abilities/Strengths  supportive husband, intelligent, ability for insight  Client Treatment Preferences  Outpatient Individual therapy  Client Statement of Needs   I need some help with managing my bipolar and with my flashbacks.  Treatment Level  Outpatient Individual therapy  Symptoms  Demonstrates an exaggerated startle response.:  (Status: improved. Depressed or irritable mood.:  (Status: maintained). Describes a reliving of the event,  particularly through dissociative flashbacks.: (Status: improved. Diminished  interest in or enjoyment of activities.: No Description Entered (Status: maintained). Displays significant psychological and/or physiological distress resulting from internal and external clues that are  reminiscent of the traumatic event.:  (Status:improved). Experiences  disturbing and persistent thoughts, images, and/or perceptions of the traumatic event.:(Status:improved). Has been exposed to a traumatic event involving actual or perceived  threat of death or serious injury.:  (Status: maintained). Impairment in social,  occupational, or other areas of functioning.: (Status: improved). Intentionally  avoids thoughts, feelings, or discussions related to the traumatic  event.:  (Status: improved). Lack of energy.: (Status: maintained). Low self-esteem.:   (Status: maintained). Reports response of intense fear, helplessness, or horror  to the traumatic event.:  (Status: improved). Social withdrawal.: No  Description  Entered (Status: maintained).  Problems Addressed  Bipolar Disorder - Depression, Posttraumatic Stress Disorder (PTSD), Goals 1. Develop healthy thinking patterns and beliefs about self, others, and the world that lead to the alleviation and help prevent the relapse of  depression. Objective Take prescribed medications as directed. Target Date: 2025-12-17 Frequency:  every 4-8 weeks Progress: 50 Modality: individual Related Interventions 1. Monitor the client for psychotropic medication prescription compliance, side effects, and  effectiveness. 2. Monitor the client's symptom improvement toward stabilization sufficient to allow participation in psychotherapy. 2. Eliminate or reduce the negative impact trauma related symptoms have  on social, occupational, and family functioning. Objective Participate in Eye Movement Desensitization and Reprocessing (EMDR) to reduce emotional distress  related to traumatic thoughts, feelings, and images. Target Date: 2024-12-17 Frequency: every 4-8 weeks Progress: 90 Modality: individual Related Interventions 1. Utilize Eye Movement Desensitization and Reprocessing (EMDR) to reduce the client's  emotional reactivity to the traumatic event and reduce PTSD symptoms. 3. No longer avoids persons, places, activities, and objects that are  reminiscent of the traumatic event. 4. Thinks about or openly discusses the traumatic event with others  without experiencing psychological or physiological distress. Diagnosis F43.10 (Posttraumatic stress disorder) - Open - [Signifier: n/a] Posttraumatic Stress  Disorder  Bipolar I disorder Conditions For Discharge Achievement of treatment goals and objectives  The patient has approved this plan.    Rebecca Impson G. Coriana Angello,  LCSW                                                                                                     "

## 2024-11-04 ENCOUNTER — Ambulatory Visit: Admitting: Psychology
